# Patient Record
Sex: Female | Born: 1970
Health system: Southern US, Community
[De-identification: ages and names within clinical notes are randomized; demographics above are authoritative.]

## PROBLEM LIST (undated history)

## (undated) DIAGNOSIS — R7303 Prediabetes: Secondary | ICD-10-CM

## (undated) DIAGNOSIS — I10 Essential (primary) hypertension: Secondary | ICD-10-CM

---

## 2004-09-19 ENCOUNTER — Other Ambulatory Visit: Admission: RE | Admit: 2004-09-19 | Discharge: 2004-09-19 | Payer: Self-pay | Admitting: Family Medicine

## 2004-11-07 ENCOUNTER — Ambulatory Visit (HOSPITAL_COMMUNITY): Admission: RE | Admit: 2004-11-07 | Discharge: 2004-11-07 | Payer: Self-pay | Admitting: Family Medicine

## 2005-12-04 ENCOUNTER — Other Ambulatory Visit: Admission: RE | Admit: 2005-12-04 | Discharge: 2005-12-04 | Payer: Self-pay | Admitting: Family Medicine

## 2006-12-04 ENCOUNTER — Encounter: Admission: RE | Admit: 2006-12-04 | Discharge: 2006-12-04 | Payer: Self-pay | Admitting: Family Medicine

## 2006-12-10 ENCOUNTER — Other Ambulatory Visit: Admission: RE | Admit: 2006-12-10 | Discharge: 2006-12-10 | Payer: Self-pay | Admitting: Family Medicine

## 2006-12-19 ENCOUNTER — Ambulatory Visit (HOSPITAL_COMMUNITY): Admission: RE | Admit: 2006-12-19 | Discharge: 2006-12-20 | Payer: Self-pay | Admitting: Neurological Surgery

## 2008-01-02 ENCOUNTER — Other Ambulatory Visit: Admission: RE | Admit: 2008-01-02 | Discharge: 2008-01-02 | Payer: Self-pay | Admitting: Family Medicine

## 2008-07-23 ENCOUNTER — Encounter: Admission: RE | Admit: 2008-07-23 | Discharge: 2008-07-23 | Payer: Self-pay | Admitting: Neurological Surgery

## 2009-07-06 ENCOUNTER — Encounter: Admission: RE | Admit: 2009-07-06 | Discharge: 2009-07-06 | Payer: Self-pay | Admitting: *Deleted

## 2010-01-13 ENCOUNTER — Emergency Department (HOSPITAL_COMMUNITY): Admission: EM | Admit: 2010-01-13 | Discharge: 2010-01-13 | Payer: Self-pay | Admitting: Emergency Medicine

## 2010-01-25 ENCOUNTER — Other Ambulatory Visit: Admission: RE | Admit: 2010-01-25 | Discharge: 2010-01-25 | Payer: Self-pay | Admitting: *Deleted

## 2010-10-24 LAB — COMPREHENSIVE METABOLIC PANEL
ALT: 45 U/L — ABNORMAL HIGH (ref 0–35)
Creatinine, Ser: 0.68 mg/dL (ref 0.4–1.2)
GFR calc Af Amer: >60 mL/min (ref >60)
GFR calc non Af Amer: >60 mL/min (ref >60)
Potassium: 4.2 mEq/L (ref 3.5–5.1)
Sodium: 138 mEq/L (ref 135–145)

## 2010-10-24 LAB — DIFFERENTIAL
Basophils Absolute: 0 10*3/uL (ref 0.0–0.1)
Basophils Relative: 1 % (ref 0–1)
Eosinophils Absolute: 0.2 10*3/uL (ref 0.0–0.7)
Monocytes Relative: 7 % (ref 3–12)
Neutro Abs: 6.6 10*3/uL (ref 1.7–7.7)
Neutrophils Relative %: 69 % (ref 43–77)

## 2010-10-24 LAB — CBC
Hemoglobin: 12.1 g/dL (ref 12.0–15.0)
MCHC: 33.6 g/dL (ref 30.0–36.0)
WBC: 9.5 10*3/uL (ref 4.0–10.5)

## 2010-10-24 LAB — MAGNESIUM: Magnesium: 2.2 mg/dL (ref 1.5–2.5)

## 2010-12-20 NOTE — Op Note (Signed)
NAMETRISTYN, PHARRIS NO.:  [id number]   MEDICAL RECORD NO.:  [id number]          PATIENT TYPE:  OIB   LOCATION:  6739                         FACILITY:  [facility]   PHYSICIAN:  Tia Alert, MD     DATE OF BIRTH:  Aug 19, 1970   DATE OF PROCEDURE:  12/19/2006  DATE OF DISCHARGE:                               OPERATIVE REPORT   PREOPERATIVE DIAGNOSIS:  Left L5-S1 disk herniation with left S1  radiculopathy.   POSTOPERATIVE DIAGNOSIS:  Left L5-S1 disk herniation with left S1  radiculopathy.   PROCEDURE:  Left lumbar hemilaminectomy, medial facetectomy,  foraminotomy, L5-S1 on the left, followed by microdiskectomy, L5-S1 on  the left, utilizing microscopic dissection.   SURGEON:  Tia Alert, MD   ASSISTANT:  Donalee Citrin, MD   ANESTHESIA:  General endotracheal.   COMPLICATIONS:  None apparent.   INDICATIONS FOR PROCEDURE:  Ms. Rossa is a very pleasant 40 year old  female who was referred with back and leg pain.  She had numbness in her  foot and weakness in her calf.  She had an MRI which showed a very large  disk herniation at L5-S1 on the left side with compression of the left  S1 nerve root and canal stenosis.  I recommended a microdiskectomy of S1  on the left side.  She understood the risks, benefits, and expected  outcome and wished to proceed.   DESCRIPTION OF PROCEDURE:  The patient was taken to the operating room  and after the induction of adequate generalized endotracheal anesthesia,  she was rolled into the prone position onto the Wilson frame and all  pressure points were padded.  Her lumbar region was prepped with  DuraPrep and then draped in the usual sterile fashion.  Local anesthesia  5 mL was injected and a small dorsal midline incision was made and  carried down to the lumbosacral fascia.  The fascia was opened and the  paraspinous musculature was taken down in a subperiosteal fashion to  expose L5-S1 on the left.  Intraoperative x-ray  confirmed my level and  then the high-speed drill and the Kerrison punch was used to perform a  hemilaminectomy, medial facetectomy and foraminotomy at L5-S1 on the  left side.  I removed the yellow ligament to expose the underlying dura  and nerve root.  The dura was tented up over a large free fragment.  This was removed with a nerve hook and pituitary rongeur.  We actually  removed 4 moderate-sized free fragments.  Then the nerve relaxed.  I  then found subannular herniation.  I incised the annulus and performed a  thorough intradiskal diskectomy until the nerve and dura lay flat.  I  then palpated with a coronary dilator and a nerve hook into the midline  and into the foramen to assure adequate decompression.  I felt no more  compression of the nerve root.  I irrigated with saline solution  containing bacitracin, dried all bleeding points, lined the dura with  Gelfoam, and then closed the muscle with 0 Vicryl, closed the  subcutaneous and subcuticular  tissue  with layers of 2-0 and 3-0 Vicryl, and closed the skin with Benzoin and  Steri-Strips.  The drapes were removed.  A sterile dressing was applied.  The patient was awakened from general anesthesia and transported to the  recovery room in stable condition.  At the end of the procedure all  sponge, needle and instrument counts were correct.      Tia Alert, MD  Electronically Signed     DSJ/MEDQ  D:  12/19/2006  T:  12/20/2006  Job:  [id number]

## 2011-11-30 ENCOUNTER — Other Ambulatory Visit (HOSPITAL_COMMUNITY)
Admission: RE | Admit: 2011-11-30 | Discharge: 2011-11-30 | Disposition: A | Discharge status: Home / Self Care | Payer: BC Managed Care – PPO | Source: Ambulatory Visit | Attending: Family Medicine | Admitting: Family Medicine

## 2011-11-30 ENCOUNTER — Other Ambulatory Visit: Payer: Self-pay | Admitting: Family Medicine

## 2011-11-30 DIAGNOSIS — Z1159 Encounter for screening for other viral diseases: Secondary | ICD-10-CM | POA: Insufficient documentation

## 2011-11-30 DIAGNOSIS — Z124 Encounter for screening for malignant neoplasm of cervix: Secondary | ICD-10-CM | POA: Insufficient documentation

## 2012-12-10 ENCOUNTER — Other Ambulatory Visit (HOSPITAL_COMMUNITY): Payer: Self-pay

## 2012-12-10 DIAGNOSIS — Z1231 Encounter for screening mammogram for malignant neoplasm of breast: Secondary | ICD-10-CM

## 2012-12-13 ENCOUNTER — Ambulatory Visit (HOSPITAL_COMMUNITY)
Admission: RE | Admit: 2012-12-13 | Discharge: 2012-12-13 | Disposition: A | Discharge status: Home / Self Care | Payer: BC Managed Care – PPO | Source: Ambulatory Visit

## 2012-12-13 DIAGNOSIS — Z1231 Encounter for screening mammogram for malignant neoplasm of breast: Secondary | ICD-10-CM

## 2013-01-22 ENCOUNTER — Other Ambulatory Visit: Payer: Self-pay | Admitting: Dermatology

## 2013-09-12 ENCOUNTER — Telehealth: Payer: Self-pay | Admitting: Cardiology

## 2013-09-12 NOTE — Telephone Encounter (Signed)
New message     Refill  Amlodipine besylate 5mg ; metoprolol 25mg ; hctz 25mg ---------[facility] script---90da supply.  Pt has not seen dr Mayford Knifeturner at new office

## 2013-09-12 NOTE — Telephone Encounter (Signed)
PCP needs to order her meds

## 2013-09-12 NOTE — Telephone Encounter (Signed)
Made pt aware

## 2013-09-12 NOTE — Telephone Encounter (Signed)
Dr Mayford Knifeurner this pt you last saw on 12/2012. At the end of the visit you said PRN follow up. Ok to give pt a year worth of her meds?

## 2014-02-20 ENCOUNTER — Other Ambulatory Visit (HOSPITAL_COMMUNITY): Payer: Self-pay | Admitting: Family Medicine

## 2014-02-20 DIAGNOSIS — Z1231 Encounter for screening mammogram for malignant neoplasm of breast: Secondary | ICD-10-CM

## 2014-03-13 ENCOUNTER — Ambulatory Visit (HOSPITAL_COMMUNITY)
Admission: RE | Admit: 2014-03-13 | Discharge: 2014-03-13 | Disposition: A | Discharge status: Home / Self Care | Payer: BC Managed Care – PPO | Source: Ambulatory Visit | Attending: Family Medicine | Admitting: Family Medicine

## 2014-03-13 DIAGNOSIS — Z1231 Encounter for screening mammogram for malignant neoplasm of breast: Secondary | ICD-10-CM | POA: Insufficient documentation

## 2015-03-25 ENCOUNTER — Other Ambulatory Visit: Payer: Self-pay | Admitting: Family Medicine

## 2015-03-25 ENCOUNTER — Other Ambulatory Visit (HOSPITAL_COMMUNITY)
Admission: RE | Admit: 2015-03-25 | Discharge: 2015-03-25 | Disposition: A | Discharge status: Home / Self Care | Payer: BLUE CROSS/BLUE SHIELD | Source: Ambulatory Visit | Attending: Family Medicine | Admitting: Family Medicine

## 2015-03-25 DIAGNOSIS — Z01411 Encounter for gynecological examination (general) (routine) with abnormal findings: Secondary | ICD-10-CM | POA: Diagnosis present

## 2015-03-25 DIAGNOSIS — Z1151 Encounter for screening for human papillomavirus (HPV): Secondary | ICD-10-CM | POA: Insufficient documentation

## 2015-03-26 LAB — CYTOLOGY - PAP

## 2015-07-16 ENCOUNTER — Other Ambulatory Visit: Payer: Self-pay

## 2015-07-16 DIAGNOSIS — Z1231 Encounter for screening mammogram for malignant neoplasm of breast: Secondary | ICD-10-CM

## 2015-08-08 HISTORY — PX: BREAST BIOPSY: SHX20

## 2015-08-18 ENCOUNTER — Other Ambulatory Visit: Payer: Self-pay | Admitting: Family Medicine

## 2015-08-18 ENCOUNTER — Ambulatory Visit
Admission: RE | Admit: 2015-08-18 | Discharge: 2015-08-18 | Disposition: A | Discharge status: Home / Self Care | Payer: BLUE CROSS/BLUE SHIELD | Source: Ambulatory Visit

## 2015-08-18 DIAGNOSIS — Z1231 Encounter for screening mammogram for malignant neoplasm of breast: Secondary | ICD-10-CM

## 2015-08-18 DIAGNOSIS — R928 Other abnormal and inconclusive findings on diagnostic imaging of breast: Secondary | ICD-10-CM

## 2015-08-25 ENCOUNTER — Ambulatory Visit
Admission: RE | Admit: 2015-08-25 | Discharge: 2015-08-25 | Disposition: A | Discharge status: Home / Self Care | Payer: BLUE CROSS/BLUE SHIELD | Source: Ambulatory Visit | Attending: Family Medicine | Admitting: Family Medicine

## 2015-08-25 ENCOUNTER — Other Ambulatory Visit: Payer: Self-pay | Admitting: Family Medicine

## 2015-08-25 DIAGNOSIS — R928 Other abnormal and inconclusive findings on diagnostic imaging of breast: Secondary | ICD-10-CM

## 2015-08-27 ENCOUNTER — Other Ambulatory Visit: Payer: Self-pay | Admitting: Family Medicine

## 2015-08-27 DIAGNOSIS — R928 Other abnormal and inconclusive findings on diagnostic imaging of breast: Secondary | ICD-10-CM

## 2015-08-30 ENCOUNTER — Ambulatory Visit
Admission: RE | Admit: 2015-08-30 | Discharge: 2015-08-30 | Disposition: A | Discharge status: Home / Self Care | Payer: BLUE CROSS/BLUE SHIELD | Source: Ambulatory Visit | Attending: Family Medicine | Admitting: Family Medicine

## 2015-08-30 DIAGNOSIS — R928 Other abnormal and inconclusive findings on diagnostic imaging of breast: Secondary | ICD-10-CM

## 2016-11-03 ENCOUNTER — Other Ambulatory Visit: Payer: Self-pay | Admitting: Family Medicine

## 2016-11-03 DIAGNOSIS — Z1231 Encounter for screening mammogram for malignant neoplasm of breast: Secondary | ICD-10-CM

## 2016-11-22 ENCOUNTER — Ambulatory Visit
Admission: RE | Admit: 2016-11-22 | Discharge: 2016-11-22 | Disposition: A | Discharge status: Home / Self Care | Payer: BLUE CROSS/BLUE SHIELD | Source: Ambulatory Visit | Attending: Family Medicine | Admitting: Family Medicine

## 2016-11-22 DIAGNOSIS — Z1231 Encounter for screening mammogram for malignant neoplasm of breast: Secondary | ICD-10-CM

## 2016-11-27 ENCOUNTER — Other Ambulatory Visit: Payer: Self-pay | Admitting: Family Medicine

## 2016-11-27 DIAGNOSIS — R928 Other abnormal and inconclusive findings on diagnostic imaging of breast: Secondary | ICD-10-CM

## 2016-12-01 ENCOUNTER — Ambulatory Visit
Admission: RE | Admit: 2016-12-01 | Discharge: 2016-12-01 | Disposition: A | Discharge status: Home / Self Care | Payer: BLUE CROSS/BLUE SHIELD | Source: Ambulatory Visit | Attending: Family Medicine | Admitting: Family Medicine

## 2016-12-01 DIAGNOSIS — R928 Other abnormal and inconclusive findings on diagnostic imaging of breast: Secondary | ICD-10-CM

## 2017-08-06 DIAGNOSIS — M7661 Achilles tendinitis, right leg: Secondary | ICD-10-CM | POA: Diagnosis not present

## 2017-08-06 DIAGNOSIS — M71571 Other bursitis, not elsewhere classified, right ankle and foot: Secondary | ICD-10-CM | POA: Diagnosis not present

## 2017-08-06 DIAGNOSIS — M7731 Calcaneal spur, right foot: Secondary | ICD-10-CM | POA: Diagnosis not present

## 2017-08-06 DIAGNOSIS — M7662 Achilles tendinitis, left leg: Secondary | ICD-10-CM | POA: Diagnosis not present

## 2017-08-06 DIAGNOSIS — M7732 Calcaneal spur, left foot: Secondary | ICD-10-CM | POA: Diagnosis not present

## 2017-08-06 DIAGNOSIS — M71572 Other bursitis, not elsewhere classified, left ankle and foot: Secondary | ICD-10-CM | POA: Diagnosis not present

## 2017-08-14 DIAGNOSIS — M71571 Other bursitis, not elsewhere classified, right ankle and foot: Secondary | ICD-10-CM | POA: Diagnosis not present

## 2017-08-14 DIAGNOSIS — M7661 Achilles tendinitis, right leg: Secondary | ICD-10-CM | POA: Diagnosis not present

## 2017-08-14 DIAGNOSIS — M71572 Other bursitis, not elsewhere classified, left ankle and foot: Secondary | ICD-10-CM | POA: Diagnosis not present

## 2017-08-27 DIAGNOSIS — M7662 Achilles tendinitis, left leg: Secondary | ICD-10-CM | POA: Diagnosis not present

## 2017-08-27 DIAGNOSIS — M7661 Achilles tendinitis, right leg: Secondary | ICD-10-CM | POA: Diagnosis not present

## 2017-10-02 DIAGNOSIS — M5432 Sciatica, left side: Secondary | ICD-10-CM | POA: Diagnosis not present

## 2017-10-08 DIAGNOSIS — Z6836 Body mass index (BMI) 36.0-36.9, adult: Secondary | ICD-10-CM | POA: Diagnosis not present

## 2017-10-08 DIAGNOSIS — M545 Low back pain: Secondary | ICD-10-CM | POA: Diagnosis not present

## 2017-10-08 DIAGNOSIS — I10 Essential (primary) hypertension: Secondary | ICD-10-CM | POA: Diagnosis not present

## 2017-10-26 DIAGNOSIS — M722 Plantar fascial fibromatosis: Secondary | ICD-10-CM | POA: Diagnosis not present

## 2017-10-26 DIAGNOSIS — M25571 Pain in right ankle and joints of right foot: Secondary | ICD-10-CM | POA: Diagnosis not present

## 2017-10-26 DIAGNOSIS — M25572 Pain in left ankle and joints of left foot: Secondary | ICD-10-CM | POA: Diagnosis not present

## 2017-10-26 DIAGNOSIS — M67879 Other specified disorders of synovium and tendon, unspecified ankle and foot: Secondary | ICD-10-CM | POA: Diagnosis not present

## 2017-11-12 ENCOUNTER — Other Ambulatory Visit: Payer: Self-pay | Admitting: Family Medicine

## 2017-11-12 DIAGNOSIS — Z1231 Encounter for screening mammogram for malignant neoplasm of breast: Secondary | ICD-10-CM

## 2017-11-30 ENCOUNTER — Ambulatory Visit
Admission: RE | Admit: 2017-11-30 | Discharge: 2017-11-30 | Disposition: A | Discharge status: Home / Self Care | Payer: BLUE CROSS/BLUE SHIELD | Source: Ambulatory Visit | Attending: Family Medicine | Admitting: Family Medicine

## 2017-11-30 DIAGNOSIS — Z1231 Encounter for screening mammogram for malignant neoplasm of breast: Secondary | ICD-10-CM | POA: Diagnosis not present

## 2018-03-26 DIAGNOSIS — I10 Essential (primary) hypertension: Secondary | ICD-10-CM | POA: Diagnosis not present

## 2018-03-26 DIAGNOSIS — E785 Hyperlipidemia, unspecified: Secondary | ICD-10-CM | POA: Diagnosis not present

## 2018-04-09 DIAGNOSIS — Z01419 Encounter for gynecological examination (general) (routine) without abnormal findings: Secondary | ICD-10-CM | POA: Diagnosis not present

## 2018-04-10 DIAGNOSIS — H029 Unspecified disorder of eyelid: Secondary | ICD-10-CM | POA: Diagnosis not present

## 2018-04-10 DIAGNOSIS — H524 Presbyopia: Secondary | ICD-10-CM | POA: Diagnosis not present

## 2018-04-10 DIAGNOSIS — D3141 Benign neoplasm of right ciliary body: Secondary | ICD-10-CM | POA: Diagnosis not present

## 2018-05-21 DIAGNOSIS — D485 Neoplasm of uncertain behavior of skin: Secondary | ICD-10-CM | POA: Diagnosis not present

## 2018-05-21 DIAGNOSIS — D23121 Other benign neoplasm of skin of left upper eyelid, including canthus: Secondary | ICD-10-CM | POA: Diagnosis not present

## 2018-07-03 DIAGNOSIS — D485 Neoplasm of uncertain behavior of skin: Secondary | ICD-10-CM | POA: Diagnosis not present

## 2018-07-03 DIAGNOSIS — L820 Inflamed seborrheic keratosis: Secondary | ICD-10-CM | POA: Diagnosis not present

## 2018-07-03 DIAGNOSIS — L919 Hypertrophic disorder of the skin, unspecified: Secondary | ICD-10-CM | POA: Diagnosis not present

## 2019-04-11 DIAGNOSIS — Z30432 Encounter for removal of intrauterine contraceptive device: Secondary | ICD-10-CM | POA: Diagnosis not present

## 2019-04-11 DIAGNOSIS — Z01411 Encounter for gynecological examination (general) (routine) with abnormal findings: Secondary | ICD-10-CM | POA: Diagnosis not present

## 2019-04-23 DIAGNOSIS — Z0000 Encounter for general adult medical examination without abnormal findings: Secondary | ICD-10-CM | POA: Diagnosis not present

## 2019-04-30 DIAGNOSIS — I10 Essential (primary) hypertension: Secondary | ICD-10-CM | POA: Diagnosis not present

## 2019-04-30 DIAGNOSIS — Z23 Encounter for immunization: Secondary | ICD-10-CM | POA: Diagnosis not present

## 2019-04-30 DIAGNOSIS — E785 Hyperlipidemia, unspecified: Secondary | ICD-10-CM | POA: Diagnosis not present

## 2019-05-01 ENCOUNTER — Other Ambulatory Visit: Payer: Self-pay | Admitting: Family Medicine

## 2019-05-01 DIAGNOSIS — Z1231 Encounter for screening mammogram for malignant neoplasm of breast: Secondary | ICD-10-CM

## 2019-06-16 ENCOUNTER — Ambulatory Visit
Admission: RE | Admit: 2019-06-16 | Discharge: 2019-06-16 | Disposition: A | Discharge status: Home / Self Care | Payer: BC Managed Care – PPO | Source: Ambulatory Visit | Attending: Family Medicine | Admitting: Family Medicine

## 2019-06-16 ENCOUNTER — Other Ambulatory Visit: Payer: Self-pay

## 2019-06-16 DIAGNOSIS — Z1231 Encounter for screening mammogram for malignant neoplasm of breast: Secondary | ICD-10-CM | POA: Diagnosis not present

## 2019-11-15 ENCOUNTER — Ambulatory Visit: Payer: 59 | Attending: Internal Medicine

## 2019-11-15 DIAGNOSIS — Z23 Encounter for immunization: Secondary | ICD-10-CM

## 2019-11-15 NOTE — Progress Notes (Signed)
   Covid-19 Vaccination Clinic  Name:  Rachael Moran    MRN: [id number] DOB: Feb 05, 1971  11/15/2019  Rachael Moran was observed post Covid-19 immunization for 15 minutes without incident. She was provided with Vaccine Information Sheet and instruction to access the V-Safe system.   Rachael Moran was instructed to call 911 with any severe reactions post vaccine: Marland Kitchen Difficulty breathing  . Swelling of face and throat  . A fast heartbeat  . A bad rash all over body  . Dizziness and weakness   Immunizations Administered    Name Date Dose VIS Date Route   Pfizer COVID-19 Vaccine 11/15/2019  9:49 AM 0.3 mL 07/18/2019 Intramuscular   Manufacturer: [employer], [employer]   Lot: [id number]   NDC: [address]-1

## 2019-12-08 ENCOUNTER — Ambulatory Visit: Payer: 59 | Attending: Internal Medicine

## 2019-12-08 DIAGNOSIS — Z23 Encounter for immunization: Secondary | ICD-10-CM

## 2019-12-08 NOTE — Progress Notes (Signed)
   Covid-19 Vaccination Clinic  Name:  ALIYHA FORNES    MRN: [id number] DOB: 03/07/1971  12/08/2019  Ms. Ellsworth was observed post Covid-19 immunization for 15 minutes without incident. She was provided with Vaccine Information Sheet and instruction to access the V-Safe system.   Ms. Laufer was instructed to call 911 with any severe reactions post vaccine: Marland Kitchen Difficulty breathing  . Swelling of face and throat  . A fast heartbeat  . A bad rash all over body  . Dizziness and weakness   Immunizations Administered    Name Date Dose VIS Date Route   Pfizer COVID-19 Vaccine 12/08/2019  3:22 PM 0.3 mL 10/01/2018 Intramuscular   Manufacturer: [employer], [employer]   Lot: [id number]   NDC: [address]-6

## 2020-06-07 ENCOUNTER — Other Ambulatory Visit: Payer: Self-pay | Admitting: Family Medicine

## 2020-06-07 DIAGNOSIS — Z1231 Encounter for screening mammogram for malignant neoplasm of breast: Secondary | ICD-10-CM

## 2020-06-21 ENCOUNTER — Other Ambulatory Visit: Payer: Self-pay

## 2020-06-21 ENCOUNTER — Ambulatory Visit
Admission: RE | Admit: 2020-06-21 | Discharge: 2020-06-21 | Disposition: A | Discharge status: Home / Self Care | Payer: 59 | Source: Ambulatory Visit | Attending: Family Medicine | Admitting: Family Medicine

## 2020-06-21 DIAGNOSIS — Z1231 Encounter for screening mammogram for malignant neoplasm of breast: Secondary | ICD-10-CM

## 2020-07-06 ENCOUNTER — Ambulatory Visit: Payer: No Typology Code available for payment source | Admitting: Podiatry

## 2020-07-06 ENCOUNTER — Other Ambulatory Visit: Payer: Self-pay

## 2020-07-06 DIAGNOSIS — Z79899 Other long term (current) drug therapy: Secondary | ICD-10-CM | POA: Diagnosis not present

## 2020-07-06 DIAGNOSIS — B351 Tinea unguium: Secondary | ICD-10-CM

## 2020-07-06 MED ORDER — TERBINAFINE HCL 250 MG PO TABS: 250.0000 mg | ORAL_TABLET | Freq: Every day | ORAL | 0 refills | Status: DC

## 2020-07-06 NOTE — Progress Notes (Signed)
Subjective:   Patient ID: Rachael Moran, female   DOB: 49 y.o.   MRN: [id number]   HPI 49 year old female presents the office today for concerns of toenail discoloration to left big toe which started in January. She states that she gets occasional discomfort the toenail but denies any swelling or redness. She first saw her primary care physician's office and was told that it was damaged the nail will grow out. She later followed up with so she had fungus and she was started on Lamisil. She completed about 6 weeks of the treatment they did not see much improvement with so this was discontinued was referred for further evaluation. She has no other concerns today.   Review of Systems  All other systems reviewed and are negative.  No past medical history on file.  Past Surgical History:  Procedure Laterality Date  . BREAST BIOPSY Right 2017    BIPHASIC EPITHELIAL AND STROMAL LESION      Current Outpatient Medications:  .  amLODipine (NORVASC) 5 MG tablet, Take 5 mg by mouth daily., Disp: , Rfl:  .  diazepam (VALIUM) 2 MG tablet, diazepam 2 mg tablet, Disp: , Rfl:  .  hydrochlorothiazide (HYDRODIURIL) 25 MG tablet, Take 25 mg by mouth daily., Disp: , Rfl:  .  meloxicam (MOBIC) 15 MG tablet, Take 15 mg by mouth daily., Disp: , Rfl:  .  misoprostol (CYTOTEC) 200 MCG tablet, misoprostol 200 mcg tablet, Disp: , Rfl:  .  neomycin-polymyxin-dexameth (MAXITROL) 0.1 % OINT, neomycin 3.5 mg/g-polymyxin B 10,000 unit/g-dexameth 0.1 % eye oint, Disp: , Rfl:  .  sertraline (ZOLOFT) 50 MG tablet, Take by mouth., Disp: , Rfl:  .  terbinafine (LAMISIL) 250 MG tablet, , Disp: , Rfl:  .  terbinafine (LAMISIL) 250 MG tablet, Take 1 tablet (250 mg total) by mouth daily., Disp: 60 tablet, Rfl: 0  Allergies  Allergen Reactions  . Lisinopril         Objective:  Physical Exam  General: AAO x3, NAD  Dermatological: Right hallux toenail is dystrophic, hypertrophic with yellow and white discoloration of the  nail. No significant pain today and there is no edema, erythema or signs of infection. There is no open lesions. There are no open sores, no preulcerative lesions, no rash or signs of infection present.  Vascular: Dorsalis Pedis artery and Posterior Tibial artery pedal pulses are 2/4 bilateral with immedate capillary fill time. There is no pain with calf compression, swelling, warmth, erythema.   Neruologic: Grossly intact via light touch bilateral.  Musculoskeletal: No gross boney pedal deformities bilateral. No pain, crepitus, or limitation noted with foot and ankle range of motion bilateral. Muscular strength 5/5 in all groups tested bilateral.  Gait: Unassisted, Nonantalgic.       Assessment:   Onychomycosis right hallux    Plan:  -Treatment options discussed including all alternatives, risks, and complications -Etiology of symptoms were discussed -I do think that she is having fungus. Completed the pictures but she showed me the nail has grown out slightly I do think the terbinafine was helpful. She has 6 more weeks of the medication and she is in a continue with this but I went ahead and prescribed an additional 2 months but will recheck a CBC LFT in about 4 to 6 weeks. She recently had blood work performed by her primary care physician and we will obtain the results of this to make sure that her liver function was checked recently. If not I will order this.  Trula Slade DPM

## 2020-07-06 NOTE — Patient Instructions (Signed)
Terbinafine oral granules What is this medicine? TERBINAFINE (TER bin a feen) is an antifungal medicine. It is used to treat certain kinds of fungal or yeast infections. This medicine may be used for other purposes; ask your health care provider or pharmacist if you have questions. COMMON BRAND NAME(S): Lamisil What should I tell my health care provider before I take this medicine? They need to know if you have any of these conditions:  drink alcoholic beverages  kidney disease  liver disease  an unusual or allergic reaction to Terbinafine, other medicines, foods, dyes, or preservatives  pregnant or trying to get pregnant  breast-feeding How should I use this medicine? Take this medicine by mouth. Follow the directions on the prescription label. Hold packet with cut line on top. Shake packet gently to settle contents. Tear packet open along cut line, or use scissors to cut across line. Carefully pour the entire contents of packet onto a spoonful of a soft food, such as pudding or other soft, non-acidic food such as mashed potatoes (do NOT use applesauce or a fruit-based food). If two packets are required for each dose, you may either sprinkle the content of both packets on one spoonful of non-acidic food, or sprinkle the contents of both packets on two spoonfuls of non-acidic food. Make sure that no granules remain in the packet. Swallow the mxiture of the food and granules without chewing. Take your medicine at regular intervals. Do not take it more often than directed. Take all of your medicine as directed even if you think you are better. Do not skip doses or stop your medicine early. Contact your pediatrician or health care professional regarding the use of this medicine in children. While this medicine may be prescribed for children as young as 4 years for selected conditions, precautions do apply. Overdosage: If you think you have taken too much of this medicine contact a poison control  center or emergency room at once. NOTE: This medicine is only for you. Do not share this medicine with others. What if I miss a dose? If you miss a dose, take it as soon as you can. If it is almost time for your next dose, take only that dose. Do not take double or extra doses. What may interact with this medicine? Do not take this medicine with any of the following medications:  thioridazine This medicine may also interact with the following medications:  beta-blockers  caffeine  cimetidine  cyclosporine  MAOIs like Carbex, Eldepryl, Marplan, Nardil, and Parnate  medicines for fungal infections like fluconazole and ketoconazole  medicines for irregular heartbeat like amiodarone, flecainide and propafenone  rifampin  SSRIs like citalopram, escitalopram, fluoxetine, fluvoxamine, paroxetine and sertraline  tricyclic antidepressants like amitriptyline, clomipramine, desipramine, imipramine, nortriptyline, and others  warfarin This list may not describe all possible interactions. Give your health care provider a list of all the medicines, herbs, non-prescription drugs, or dietary supplements you use. Also tell them if you smoke, drink alcohol, or use illegal drugs. Some items may interact with your medicine. What should I watch for while using this medicine? Your doctor may monitor your liver function. Tell your doctor right away if you have nausea or vomiting, loss of appetite, stomach pain on your right upper side, yellow skin, dark urine, light stools, or are over tired. This medicine may cause serious skin reactions. They can happen weeks to months after starting the medicine. Contact your health care provider right away if you notice fevers or flu-like symptoms   with a rash. The rash may be red or purple and then turn into blisters or peeling of the skin. Or, you might notice a red rash with swelling of the face, lips or lymph nodes in your neck or under your arms. You need to take  this medicine for 6 weeks or longer to cure the fungal infection. Take your medicine regularly for as long as your doctor or health care provider tells you to. What side effects may I notice from receiving this medicine? Side effects that you should report to your doctor or health care professional as soon as possible:  allergic reactions like skin rash or hives, swelling of the face, lips, or tongue  change in vision  dark urine  fever or infection  general ill feeling or flu-like symptoms  light-colored stools  loss of appetite, nausea  rash, fever, and swollen lymph nodes  redness, blistering, peeling or loosening of the skin, including inside the mouth  right upper belly pain  unusually weak or tired  yellowing of the eyes or skin Side effects that usually do not require medical attention (report to your doctor or health care professional if they continue or are bothersome):  changes in taste  diarrhea  hair loss  muscle or joint pain  stomach upset This list may not describe all possible side effects. Call your doctor for medical advice about side effects. You may report side effects to FDA at 1-800-FDA-1088. Where should I keep my medicine? Keep out of the reach of children. Store at room temperature between 15 and 30 degrees C (59 and 86 degrees F). Throw away any unused medicine after the expiration date. NOTE: This sheet is a summary. It may not cover all possible information. If you have questions about this medicine, talk to your doctor, pharmacist, or health care provider.  2020 Elsevier/Gold Standard (2018-11-01 15:35:11)  

## 2020-07-07 ENCOUNTER — Telehealth: Payer: Self-pay

## 2020-07-07 NOTE — Telephone Encounter (Signed)
Received lab results via fax from Dr. Marland Mcalpine office. Reviewed with Dr. Ardelle Anton. I called and spoke with the patient, advising that her liver function is normal and she can begin taking Lamisil. She will have new labs drawn in 4 to 6 weeks.

## 2020-08-19 ENCOUNTER — Ambulatory Visit: Payer: No Typology Code available for payment source | Admitting: Podiatry

## 2020-08-19 ENCOUNTER — Other Ambulatory Visit: Payer: Self-pay

## 2020-08-19 DIAGNOSIS — B351 Tinea unguium: Secondary | ICD-10-CM | POA: Diagnosis not present

## 2020-08-19 DIAGNOSIS — Z79899 Other long term (current) drug therapy: Secondary | ICD-10-CM | POA: Diagnosis not present

## 2020-08-19 LAB — CBC WITH DIFFERENTIAL/PLATELET
Absolute Monocytes: 577 cells/uL (ref 200–950)
Basophils Absolute: 37 cells/uL (ref 0–200)
Basophils Relative: 0.5 %
Eosinophils Absolute: 311 cells/uL (ref 15–500)
Eosinophils Relative: 4.2 %
HCT: 39.1 % (ref 35.0–45.0)
Hemoglobin: 13.1 g/dL (ref 11.7–15.5)
Lymphs Abs: 1843 cells/uL (ref 850–3900)
MCH: 28.2 pg (ref 27.0–33.0)
MCHC: 33.5 g/dL (ref 32.0–36.0)
MCV: 84.1 fL (ref 80.0–100.0)
MPV: 10.1 fL (ref 7.5–12.5)
Monocytes Relative: 7.8 %
Neutro Abs: 4632 cells/uL (ref 1500–7800)
Neutrophils Relative %: 62.6 %
Platelets: 328 10*3/uL (ref 140–400)
RBC: 4.65 10*6/uL (ref 3.80–5.10)
RDW: 13.7 % (ref 11.0–15.0)
Total Lymphocyte: 24.9 %
WBC: 7.4 10*3/uL (ref 3.8–10.8)

## 2020-08-19 LAB — HEPATIC FUNCTION PANEL
AG Ratio: 1.6 (calc) (ref 1.0–2.5)
ALT: 26 U/L (ref 6–29)
AST: 19 U/L (ref 10–35)
Albumin: 4.5 g/dL (ref 3.6–5.1)
Alkaline phosphatase (APISO): 92 U/L (ref 37–153)
Bilirubin, Direct: 0 mg/dL (ref 0.0–0.2)
Globulin: 2.8 g/dL (calc) (ref 1.9–3.7)
Indirect Bilirubin: 0.2 mg/dL (calc) (ref 0.2–1.2)
Total Bilirubin: 0.2 mg/dL (ref 0.2–1.2)
Total Protein: 7.3 g/dL (ref 6.1–8.1)

## 2020-08-25 NOTE — Progress Notes (Signed)
Subjective: 50 year old female presents the office today for follow-up evaluation of nail fungus, currently on Lamisil.  She states that she is tolerating medication well without any side effects.  Recently had blood work performed.  Denies any systemic complaints such as fevers, chills, nausea, vomiting. No acute changes since last appointment, and no other complaints at this time.   Objective: AAO x3, NAD DP/PT pulses palpable bilaterally, CRT less than 3 seconds There is some clearing of the proximal aspect the nail fold however there nails in general still hypertrophic, dystrophic with yellow discoloration.  No pain in the nails currently there is no incision drainage signs of infection. No pain with calf compression, swelling, warmth, erythema  Assessment: 50 year old female with onychomycosis, currently on Lamisil  Plan: -All treatment options discussed with the patient including all alternatives, risks, complications.  -So far tolerating Lamisil well.  Reviewed blood work which is normal.  We will finish out the 90 days of Lamisil.  Discussed that at that point if needed we can continue but will recheck a CBC and LFT we decided to extend longer than the 90 days. -Patient encouraged to call the office with any questions, concerns, change in symptoms.   Vivi Barrack DPM

## 2020-09-27 ENCOUNTER — Telehealth: Payer: Self-pay

## 2020-09-27 NOTE — Telephone Encounter (Signed)
Pt is seeing Dr. Ardelle Anton for toe fungus. Pt would like to know if results are back from the toenail culture. Pt didn't know if she had missed a call due to cell phone issue. Pt can be reach via home number at [phone].

## 2020-09-28 NOTE — Telephone Encounter (Signed)
Attempted to call patient left VM

## 2020-09-29 NOTE — Telephone Encounter (Signed)
I have called patient to go over the results. She is going to finish the Lamisil she has. I am going to order a compound cream from [place]. We will call her with laser pricing.

## 2020-09-29 NOTE — Telephone Encounter (Signed)
Patient returning phone call from Dr. Ardelle Anton for lab results. Please call patient at earliest convenience

## 2020-10-01 ENCOUNTER — Other Ambulatory Visit: Payer: Self-pay

## 2020-10-01 ENCOUNTER — Telehealth: Payer: Self-pay

## 2020-10-01 MED ORDER — NONFORMULARY OR COMPOUNDED ITEM: 3 refills | Status: DC

## 2020-10-01 NOTE — Telephone Encounter (Signed)
Prescription for anti-fungal nail solution faxed to [facility]

## 2021-06-10 ENCOUNTER — Other Ambulatory Visit: Payer: Self-pay | Admitting: Family Medicine

## 2021-06-10 DIAGNOSIS — Z1231 Encounter for screening mammogram for malignant neoplasm of breast: Secondary | ICD-10-CM

## 2021-07-14 ENCOUNTER — Other Ambulatory Visit: Payer: Self-pay

## 2021-07-14 ENCOUNTER — Ambulatory Visit
Admission: RE | Admit: 2021-07-14 | Discharge: 2021-07-14 | Disposition: A | Discharge status: Home / Self Care | Payer: No Typology Code available for payment source | Source: Ambulatory Visit | Attending: Family Medicine | Admitting: Family Medicine

## 2021-07-14 DIAGNOSIS — Z1231 Encounter for screening mammogram for malignant neoplasm of breast: Secondary | ICD-10-CM

## 2021-07-19 ENCOUNTER — Other Ambulatory Visit: Payer: Self-pay | Admitting: Family Medicine

## 2021-07-19 DIAGNOSIS — R928 Other abnormal and inconclusive findings on diagnostic imaging of breast: Secondary | ICD-10-CM

## 2021-08-26 ENCOUNTER — Ambulatory Visit: Admission: RE | Admit: 2021-08-26 | Payer: No Typology Code available for payment source | Source: Ambulatory Visit

## 2021-08-26 ENCOUNTER — Ambulatory Visit
Admission: RE | Admit: 2021-08-26 | Discharge: 2021-08-26 | Disposition: A | Discharge status: Home / Self Care | Payer: No Typology Code available for payment source | Source: Ambulatory Visit | Attending: Family Medicine | Admitting: Family Medicine

## 2021-08-26 DIAGNOSIS — R928 Other abnormal and inconclusive findings on diagnostic imaging of breast: Secondary | ICD-10-CM

## 2022-04-14 ENCOUNTER — Ambulatory Visit: Payer: No Typology Code available for payment source | Admitting: Podiatry

## 2022-04-14 ENCOUNTER — Ambulatory Visit (INDEPENDENT_AMBULATORY_CARE_PROVIDER_SITE_OTHER): Payer: No Typology Code available for payment source

## 2022-04-14 DIAGNOSIS — M7731 Calcaneal spur, right foot: Secondary | ICD-10-CM | POA: Diagnosis not present

## 2022-04-14 DIAGNOSIS — B351 Tinea unguium: Secondary | ICD-10-CM | POA: Diagnosis not present

## 2022-04-14 DIAGNOSIS — M79671 Pain in right foot: Secondary | ICD-10-CM

## 2022-04-14 DIAGNOSIS — M7661 Achilles tendinitis, right leg: Secondary | ICD-10-CM | POA: Diagnosis not present

## 2022-04-14 DIAGNOSIS — M21619 Bunion of unspecified foot: Secondary | ICD-10-CM | POA: Diagnosis not present

## 2022-04-14 NOTE — Progress Notes (Unsigned)
Subjective: Chief Complaint  Patient presents with   Foot Pain    Patient came in today for right tendon pain which started 10 mths ago, the pain has since gone away, Patient denies any pain, TX: Icing, stretching,  patient also has a callus on the left foot, and left foot toe fungus, TX: fungi nail, X-Rays taken today    51 year old female presents for above concerns.  She states that she been trying to exercise more around December.  She has been experiencing discomfort on the Achilles tendon.  She is a hurts more when she took the pressure off of her foot.  She states that when she wear 3 and she feels it helped.  She states that she rested it for some time and about 6 to 8 weeks ago she started to "test it out" and try become more active.  She wants to get back to more exercise.  No injuries that she reports.  Also states that she still has fungus on her left big toenail.  No pain currently.  No swelling redness or any drainage.   Objective: AAO x3, NAD DP/PT pulses palpable bilaterally, CRT less than 3 seconds Bunion present on left side resulting in hyperkeratotic lesion.  No ongoing ulceration drainage or signs of infection. Left hallux nail to be mildly hypertrophic, dystrophic with yellow, brown discoloration.  No edema, erythema or signs of infection.  No open lesions. Significant tenderness on the Achilles tendon on the right side, able to palpate any area pinpoint tenderness and there is no deficit noted in the Achilles tendon.  Equinus is present.  No edema to the area.  Negative Tinel sign.  MMT 5/5. No pain with calf compression, swelling, warmth, erythema  Assessment: 51 year old female with Achilles tendinitis, onychomycosis, bunion  Plan: -All treatment options discussed with the patient including all alternatives, risks, complications.  -X-rays obtained and reviewed.  3 views of the right foot and 2 views of the right ankle were obtained.  There is an increased first  metatarsal angle.  Significant calcaneal spurring and calcifications noted within the distal Achilles tendon.  No evidence of acute fracture. -Regards to the Achilles tendon we discussed stretching, icing on a regular basis.  Discussed shoes and good arch support.  Discussed gradual increase activity level but holding off on high impact activities.  Make sure she is stretching before and after activity and good arch supports will hopefully be helpful.  She can use a heel lift in her shoe as needed.  This was dispensed today. -Debrided the callus with any complications or bleeding.  Offloading, moisturizer. -Sharply debrided the nails today with any complications or bleeding.  I did culture this today.  This was sent to [facility].  -Patient encouraged to call the office with any questions, concerns, change in symptoms.

## 2022-04-14 NOTE — Patient Instructions (Addendum)

## 2022-04-20 ENCOUNTER — Telehealth: Payer: Self-pay | Admitting: Podiatry

## 2022-04-20 NOTE — Telephone Encounter (Signed)
[name]- Please let the patient know that the culture did show fungus. The best option for fungus is to do oral Lamisil but we would need to check blood work prior to starting to make sure liver function is normal as this medication can affect the liver. If he did not want to do oral medication then we can try topical. Thanks.

## 2022-04-25 ENCOUNTER — Other Ambulatory Visit: Payer: Self-pay

## 2022-04-25 DIAGNOSIS — Z79899 Other long term (current) drug therapy: Secondary | ICD-10-CM

## 2022-05-03 ENCOUNTER — Other Ambulatory Visit: Payer: Self-pay | Admitting: Podiatry

## 2022-05-03 DIAGNOSIS — M7731 Calcaneal spur, right foot: Secondary | ICD-10-CM

## 2022-06-26 ENCOUNTER — Other Ambulatory Visit (HOSPITAL_COMMUNITY): Payer: Self-pay | Admitting: Family Medicine

## 2022-06-26 DIAGNOSIS — E782 Mixed hyperlipidemia: Secondary | ICD-10-CM

## 2022-07-24 IMAGING — MG MM DIGITAL DIAGNOSTIC UNILAT*L* W/ TOMO W/ CAD
6 series · 6 of 18 positions shown · non-contrast
Comparison: Previous exam(s).

CLINICAL DATA: 50-year-old female presenting as a recall from
screening for possible left breast asymmetry.

EXAM:
DIGITAL DIAGNOSTIC UNILATERAL LEFT MAMMOGRAM WITH TOMOSYNTHESIS AND
CAD
TECHNIQUE: Left digital diagnostic mammography and breast tomosynthesis was
performed. The images were evaluated with computer-aided detection.

[L CC synth-2D]
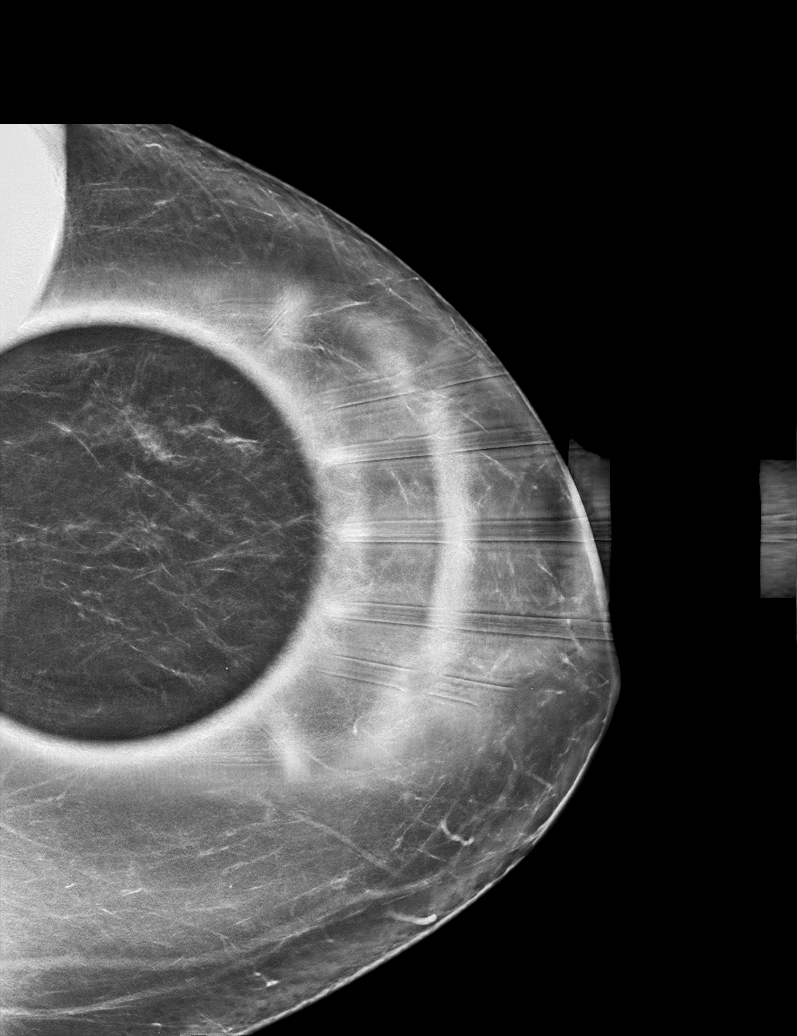

[L MLO synth-2D (1 of 2)]
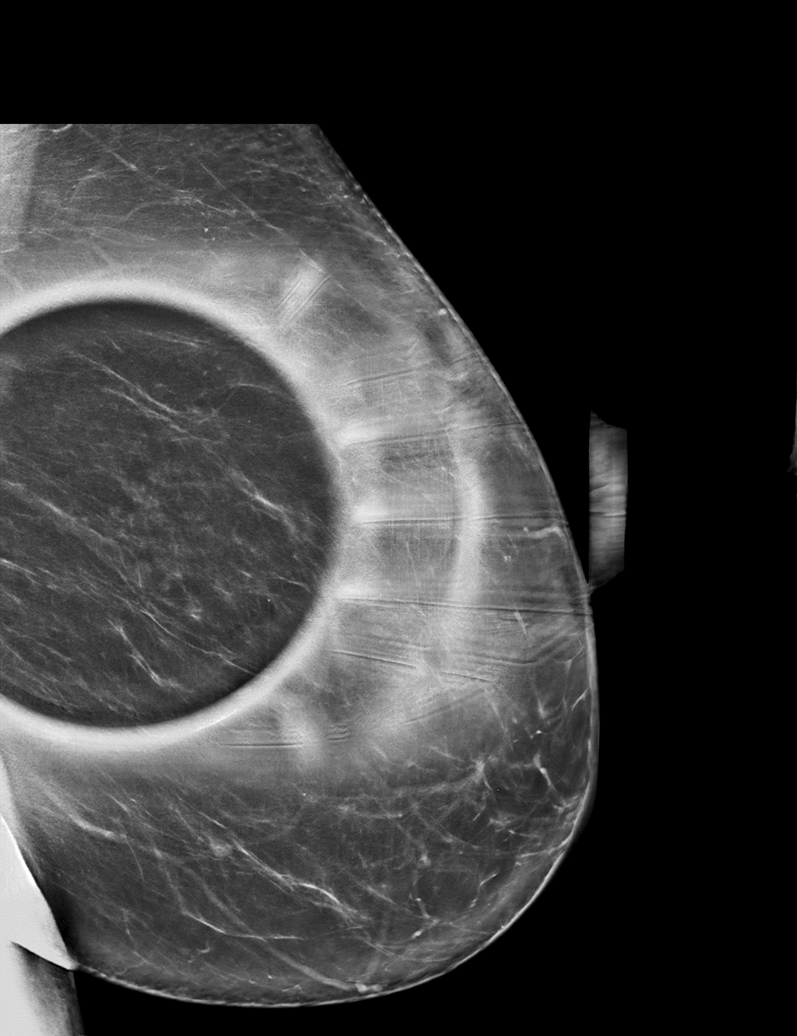

[L MLO synth-2D (2 of 2)]
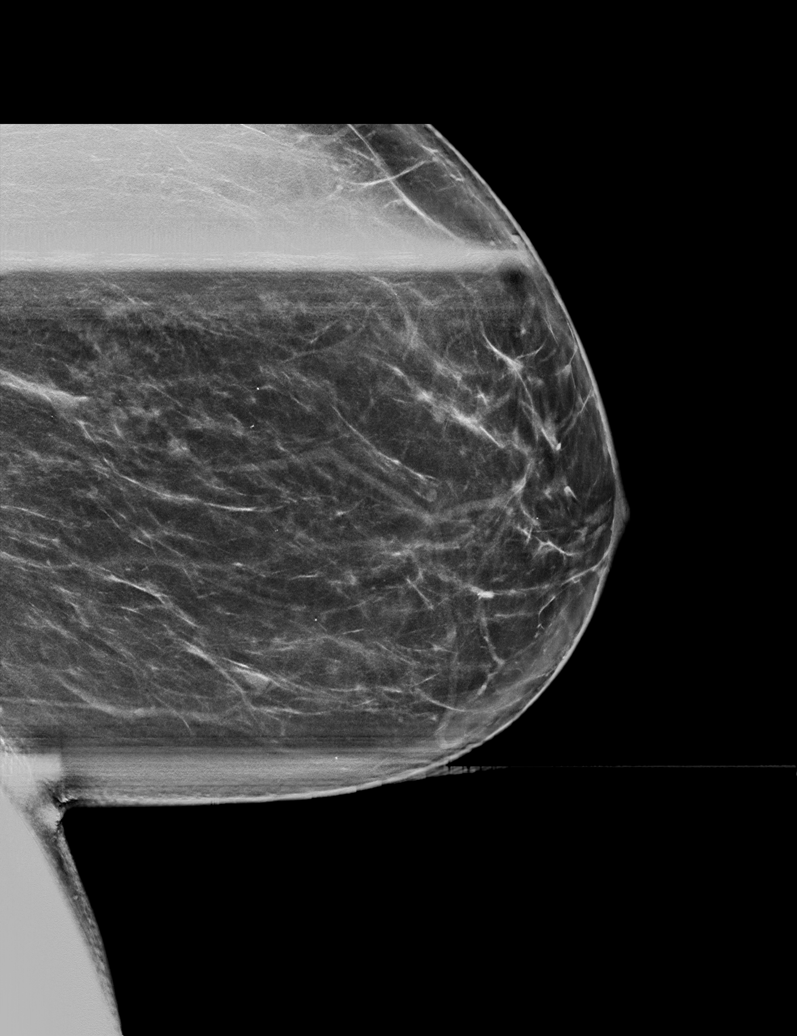

[L MLO tomo (1 of 2) · tomo slice 43/85.0]
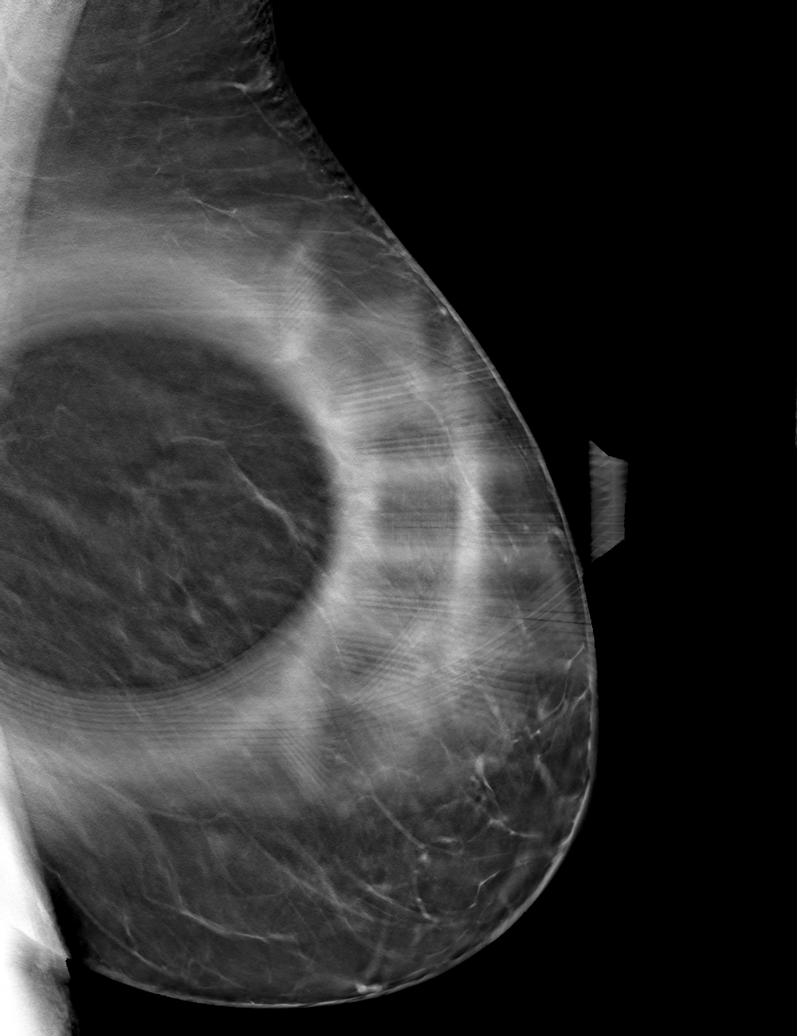

[L CC tomo · tomo slice 37/73.0]
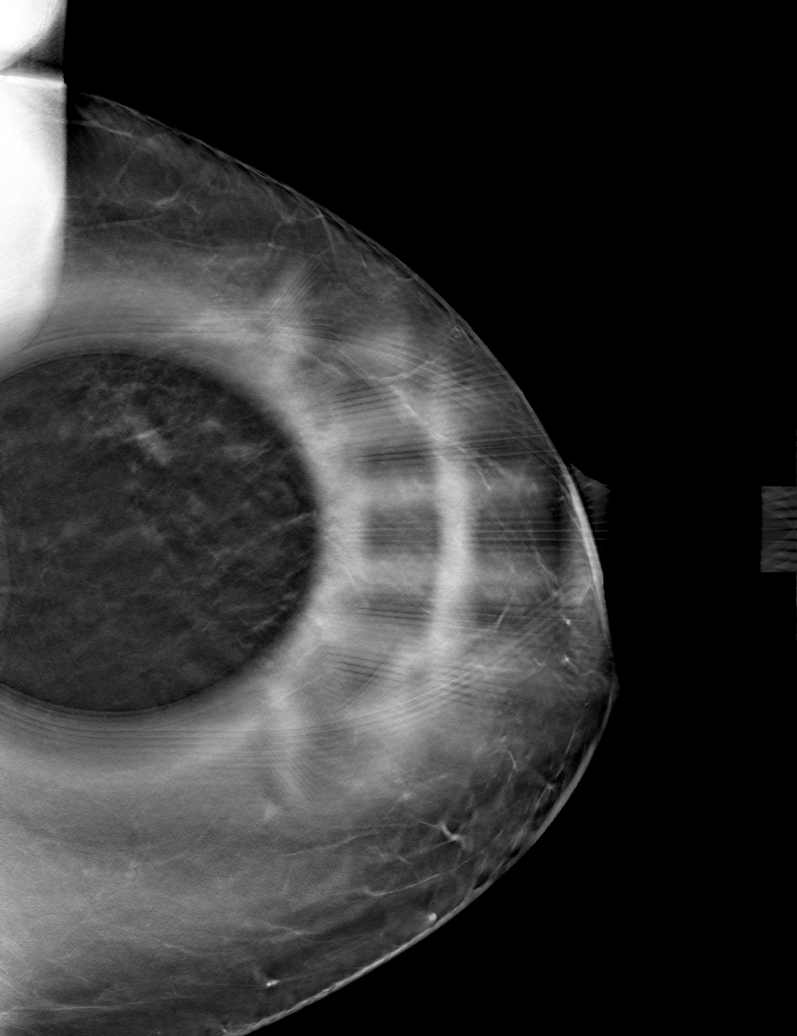

[L MLO tomo (2 of 2) · tomo slice 40/79.0]
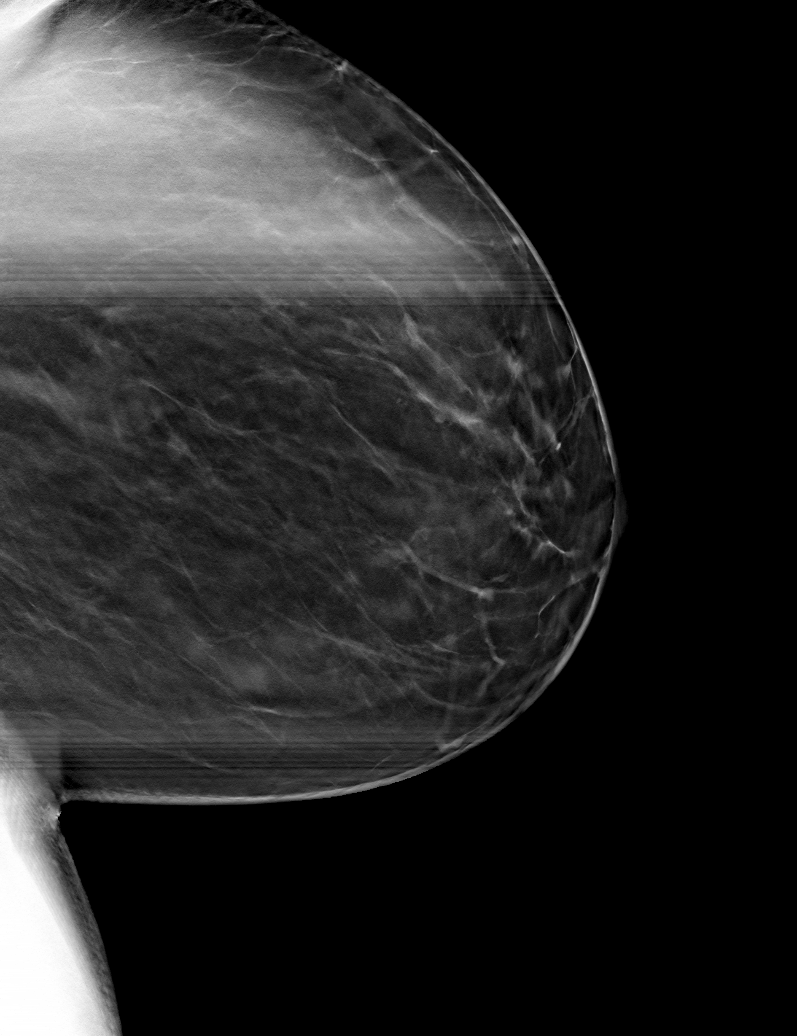

[6 of 18 positions shown; findings below may reference images not displayed]

ACR Breast Density Category b: There are scattered areas of
fibroglandular density.
FINDINGS: Spot compression tomosynthesis CC views of the left breast were
performed for a questioned asymmetry in the outer left breast. On
the additional imaging the asymmetry partially effaces and is stable
dating back to at least 4358, consistent with a benign process.
There is no new suspicious mass or distortion. No new findings
elsewhere in the left breast.
IMPRESSION: Stable benign asymmetry in the outer left breast.

RECOMMENDATION:
Screening mammogram in one year.(Code:O8-T-G1I)

I have discussed the findings and recommendations with the patient.
If applicable, a reminder letter will be sent to the patient
regarding the next appointment.

BI-RADS CATEGORY  2: Benign.

## 2023-02-21 ENCOUNTER — Ambulatory Visit (HOSPITAL_BASED_OUTPATIENT_CLINIC_OR_DEPARTMENT_OTHER)
Admission: RE | Admit: 2023-02-21 | Discharge: 2023-02-21 | Disposition: A | Discharge status: Home / Self Care | Payer: No Typology Code available for payment source | Source: Ambulatory Visit | Attending: Family Medicine | Admitting: Family Medicine

## 2023-02-21 ENCOUNTER — Other Ambulatory Visit (HOSPITAL_BASED_OUTPATIENT_CLINIC_OR_DEPARTMENT_OTHER): Payer: Self-pay | Admitting: Family Medicine

## 2023-02-21 DIAGNOSIS — M25561 Pain in right knee: Secondary | ICD-10-CM | POA: Insufficient documentation

## 2023-03-27 ENCOUNTER — Other Ambulatory Visit (HOSPITAL_COMMUNITY)
Admission: RE | Admit: 2023-03-27 | Discharge: 2023-03-27 | Disposition: A | Discharge status: Home / Self Care | Payer: No Typology Code available for payment source | Source: Ambulatory Visit | Attending: Family Medicine | Admitting: Family Medicine

## 2023-03-27 DIAGNOSIS — Z01419 Encounter for gynecological examination (general) (routine) without abnormal findings: Secondary | ICD-10-CM | POA: Diagnosis present

## 2023-04-02 LAB — CYTOLOGY - PAP
Comment: NEGATIVE
Diagnosis: NEGATIVE
High risk HPV: NEGATIVE

## 2023-04-03 ENCOUNTER — Other Ambulatory Visit: Payer: Self-pay | Admitting: Family Medicine

## 2023-04-03 DIAGNOSIS — N939 Abnormal uterine and vaginal bleeding, unspecified: Secondary | ICD-10-CM

## 2023-04-13 ENCOUNTER — Ambulatory Visit
Admission: RE | Admit: 2023-04-13 | Discharge: 2023-04-13 | Disposition: A | Discharge status: Home / Self Care | Payer: No Typology Code available for payment source | Source: Ambulatory Visit | Attending: Family Medicine | Admitting: Family Medicine

## 2023-04-13 DIAGNOSIS — N939 Abnormal uterine and vaginal bleeding, unspecified: Secondary | ICD-10-CM

## 2023-09-14 ENCOUNTER — Other Ambulatory Visit: Payer: Self-pay | Admitting: Family Medicine

## 2023-09-14 DIAGNOSIS — Z1231 Encounter for screening mammogram for malignant neoplasm of breast: Secondary | ICD-10-CM

## 2023-10-04 ENCOUNTER — Ambulatory Visit
Admission: RE | Admit: 2023-10-04 | Discharge: 2023-10-04 | Disposition: A | Discharge status: Home / Self Care | Payer: No Typology Code available for payment source | Source: Ambulatory Visit | Attending: Family Medicine | Admitting: Family Medicine

## 2023-10-04 DIAGNOSIS — Z1231 Encounter for screening mammogram for malignant neoplasm of breast: Secondary | ICD-10-CM

## 2023-10-22 ENCOUNTER — Emergency Department (HOSPITAL_BASED_OUTPATIENT_CLINIC_OR_DEPARTMENT_OTHER)

## 2023-10-22 ENCOUNTER — Encounter (HOSPITAL_BASED_OUTPATIENT_CLINIC_OR_DEPARTMENT_OTHER): Payer: Self-pay | Admitting: Emergency Medicine

## 2023-10-22 ENCOUNTER — Emergency Department (HOSPITAL_BASED_OUTPATIENT_CLINIC_OR_DEPARTMENT_OTHER)
Admission: EM | Admit: 2023-10-22 | Discharge: 2023-10-22 | Disposition: A | Discharge status: Home / Self Care | Attending: Emergency Medicine | Admitting: Emergency Medicine

## 2023-10-22 ENCOUNTER — Other Ambulatory Visit: Payer: Self-pay

## 2023-10-22 DIAGNOSIS — E860 Dehydration: Secondary | ICD-10-CM

## 2023-10-22 DIAGNOSIS — N83202 Unspecified ovarian cyst, left side: Secondary | ICD-10-CM

## 2023-10-22 DIAGNOSIS — K8020 Calculus of gallbladder without cholecystitis without obstruction: Secondary | ICD-10-CM

## 2023-10-22 DIAGNOSIS — D259 Leiomyoma of uterus, unspecified: Secondary | ICD-10-CM | POA: Diagnosis not present

## 2023-10-22 DIAGNOSIS — R1084 Generalized abdominal pain: Secondary | ICD-10-CM

## 2023-10-22 DIAGNOSIS — R1010 Upper abdominal pain, unspecified: Secondary | ICD-10-CM | POA: Diagnosis present

## 2023-10-22 DIAGNOSIS — R112 Nausea with vomiting, unspecified: Secondary | ICD-10-CM

## 2023-10-22 LAB — COMPREHENSIVE METABOLIC PANEL
ALT: 36 U/L (ref 0–44)
AST: 18 U/L (ref 15–41)
Albumin: 4.4 g/dL (ref 3.5–5.0)
Alkaline Phosphatase: 73 U/L (ref 38–126)
Anion gap: 10 (ref 5–15)
BUN: 19 mg/dL (ref 6–20)
CO2: 26 mmol/L (ref 22–32)
Calcium: 9.1 mg/dL (ref 8.9–10.3)
Chloride: 102 mmol/L (ref 98–111)
Creatinine, Ser: 0.74 mg/dL (ref 0.44–1.00)
GFR, Estimated: >60 mL/min (ref >60)
Glucose, Bld: 161 mg/dL — ABNORMAL HIGH (ref 70–99)
Potassium: 3.6 mmol/L (ref 3.5–5.1)
Sodium: 138 mmol/L (ref 135–145)
Total Bilirubin: 0.5 mg/dL (ref 0.0–1.2)
Total Protein: 7.3 g/dL (ref 6.5–8.1)

## 2023-10-22 LAB — URINALYSIS, ROUTINE W REFLEX MICROSCOPIC
Bilirubin Urine: NEGATIVE
Glucose, UA: NEGATIVE mg/dL
Hgb urine dipstick: NEGATIVE
Ketones, ur: 15 mg/dL — AB
Leukocytes,Ua: NEGATIVE
Nitrite: NEGATIVE
Protein, ur: 30 mg/dL — AB
Specific Gravity, Urine: >1.046 — ABNORMAL HIGH (ref 1.005–1.030)
pH: 7 (ref 5.0–8.0)

## 2023-10-22 LAB — CBC
HCT: 41.6 % (ref 36.0–46.0)
Hemoglobin: 14.1 g/dL (ref 12.0–15.0)
MCH: 28.6 pg (ref 26.0–34.0)
MCHC: 33.9 g/dL (ref 30.0–36.0)
MCV: 84.4 fL (ref 80.0–100.0)
Platelets: 257 10*3/uL (ref 150–400)
RBC: 4.93 MIL/uL (ref 3.87–5.11)
RDW: 14.1 % (ref 11.5–15.5)
WBC: 9.1 10*3/uL (ref 4.0–10.5)
nRBC: 0 % (ref 0.0–0.2)

## 2023-10-22 LAB — RESP PANEL BY RT-PCR (RSV, FLU A&B, COVID)  RVPGX2
Influenza A by PCR: NEGATIVE
Influenza B by PCR: NEGATIVE
Resp Syncytial Virus by PCR: NEGATIVE
SARS Coronavirus 2 by RT PCR: NEGATIVE

## 2023-10-22 LAB — PREGNANCY, URINE: Preg Test, Ur: NEGATIVE

## 2023-10-22 LAB — LIPASE, BLOOD: Lipase: 13 U/L (ref 11–51)

## 2023-10-22 LAB — TROPONIN I (HIGH SENSITIVITY): Troponin I (High Sensitivity): <2 ng/L (ref <18)

## 2023-10-22 MED ORDER — IOHEXOL 300 MG/ML  SOLN
100.0000 mL | Freq: Once | INTRAMUSCULAR | Status: AC | PRN
  Administered 2023-10-22: 100 mL via INTRAVENOUS

## 2023-10-22 MED ORDER — LACTATED RINGERS IV BOLUS: 1000.0000 mL | Freq: Once | INTRAVENOUS | Status: DC

## 2023-10-22 MED ORDER — ONDANSETRON 8 MG PO TBDP: 8.0000 mg | ORAL_TABLET | Freq: Three times a day (TID) | ORAL | 0 refills | Status: AC | PRN

## 2023-10-22 MED ORDER — LACTATED RINGERS IV BOLUS
1000.0000 mL | Freq: Once | INTRAVENOUS | Status: AC
  Administered 2023-10-22: 1000 mL via INTRAVENOUS

## 2023-10-22 MED ORDER — ONDANSETRON HCL 4 MG/2ML IJ SOLN
4.0000 mg | Freq: Once | INTRAMUSCULAR | Status: AC
  Administered 2023-10-22: 4 mg via INTRAVENOUS
  Filled 2023-10-22: qty 2

## 2023-10-22 MED ORDER — MORPHINE SULFATE (PF) 4 MG/ML IV SOLN
4.0000 mg | Freq: Once | INTRAVENOUS | Status: AC
  Administered 2023-10-22: 4 mg via INTRAVENOUS
  Filled 2023-10-22: qty 1

## 2023-10-22 NOTE — ED Provider Notes (Signed)
 [facility] Provider Note   CSN: [id number] Arrival date & time: 10/22/23  1146     History  Chief Complaint  Patient presents with   Abdominal Pain    Rachael Moran is a 53 y.o. female.  Pt with nausea, vomiting, diarrhea, and abd cramping/pain in past day. Symptoms onset early this AM, vomited several times - no bloody or bilious emesis. No abd distension. Crampy, intermittent, diffuse abd discomfort - no constant, focal, or severe abdominal pain. Had one loose to watery bm, not bloody. No specific known ill contacts or bad food ingestion. No recent change in meds.  No GLP meds. No hx pud, pancreatitis or gallstones. No fever/chills. In past 1-2 days, some sneezing, non prod cough. No chest pain or sob.   The history is provided by the patient and a relative.  Abdominal Pain Associated symptoms: cough, diarrhea, nausea and vomiting   Associated symptoms: no chest pain, no chills, no dysuria, no fever, no shortness of breath, no sore throat and no vaginal bleeding        Home Medications Prior to Admission medications   Medication Sig Start Date End Date Taking? Authorizing Provider  amLODipine (NORVASC) 5 MG tablet Take 5 mg by mouth daily. 06/07/20   [provider]  hydrochlorothiazide (HYDRODIURIL) 25 MG tablet Take 25 mg by mouth daily. 06/04/20   [provider]      Allergies    Lisinopril    Review of Systems   Review of Systems  Constitutional:  Negative for chills and fever.  HENT:  Positive for congestion. Negative for sore throat.   Eyes:  Negative for redness.  Respiratory:  Positive for cough. Negative for shortness of breath.   Cardiovascular:  Negative for chest pain and leg swelling.  Gastrointestinal:  Positive for abdominal pain, diarrhea, nausea and vomiting.  Genitourinary:  Negative for dysuria, flank pain and vaginal bleeding.  Musculoskeletal:  Negative for back pain and neck pain.   Skin:  Negative for rash.  Neurological:  Negative for headaches.    Physical Exam Updated Vital Signs BP (!) 106/46   Pulse 96   Temp 98.6 F (37 C)   Resp 18   SpO2 93%  Physical Exam Vitals and nursing note reviewed.  Constitutional:      Appearance: Normal appearance. She is well-developed.  HENT:     Head: Atraumatic.     Nose: Nose normal.     Mouth/Throat:     Mouth: Mucous membranes are moist.     Pharynx: Oropharynx is clear. No oropharyngeal exudate or posterior oropharyngeal erythema.  Eyes:     General: No scleral icterus.    Conjunctiva/sclera: Conjunctivae normal.  Neck:     Trachea: No tracheal deviation.     Comments: No stiffness or rigidity.  Cardiovascular:     Rate and Rhythm: Normal rate and regular rhythm.     Pulses: Normal pulses.     Heart sounds: Normal heart sounds. No murmur heard.    No friction rub. No gallop.  Pulmonary:     Effort: Pulmonary effort is normal. No respiratory distress.     Breath sounds: Normal breath sounds.  Abdominal:     General: Bowel sounds are normal. There is no distension.     Palpations: Abdomen is soft. There is no mass.     Tenderness: There is no abdominal tenderness. There is no guarding.  Genitourinary:    Comments: No cva tenderness.  Musculoskeletal:        General: No swelling or tenderness.     Cervical back: Normal range of motion and neck supple. No rigidity. No muscular tenderness.     Right lower leg: No edema.     Left lower leg: No edema.  Lymphadenopathy:     Cervical: No cervical adenopathy.  Skin:    General: Skin is warm and dry.     Findings: No rash.  Neurological:     Mental Status: She is alert.     Comments: Alert, speech normal.   Psychiatric:        Mood and Affect: Mood normal.     ED Results / Procedures / Treatments   Labs (all labs ordered are listed, but only abnormal results are displayed) Results for orders placed or performed during the hospital encounter of  10/22/23  Resp panel by RT-PCR (RSV, Flu A&B, Covid) Anterior Nasal Swab   Collection Time: 10/22/23  1:00 PM   Specimen: Anterior Nasal Swab  Result Value Ref Range   SARS Coronavirus 2 by RT PCR NEGATIVE NEGATIVE   Influenza A by PCR NEGATIVE NEGATIVE   Influenza B by PCR NEGATIVE NEGATIVE   Resp Syncytial Virus by PCR NEGATIVE NEGATIVE  CBC   Collection Time: 10/22/23  1:00 PM  Result Value Ref Range   WBC 9.1 4.0 - 10.5 K/uL   RBC 4.93 3.87 - 5.11 MIL/uL   Hemoglobin 14.1 12.0 - 15.0 g/dL   HCT 16.1 09.6 - 04.5 %   MCV 84.4 80.0 - 100.0 fL   MCH 28.6 26.0 - 34.0 pg   MCHC 33.9 30.0 - 36.0 g/dL   RDW 40.9 81.1 - 91.4 %   Platelets 257 150 - 400 K/uL   nRBC 0.0 0.0 - 0.2 %  Comprehensive metabolic panel   Collection Time: 10/22/23  1:00 PM  Result Value Ref Range   Sodium 138 135 - 145 mmol/L   Potassium 3.6 3.5 - 5.1 mmol/L   Chloride 102 98 - 111 mmol/L   CO2 26 22 - 32 mmol/L   Glucose, Bld 161 (H) 70 - 99 mg/dL   BUN 19 6 - 20 mg/dL   Creatinine, Ser 7.82 0.44 - 1.00 mg/dL   Calcium 9.1 8.9 - 95.6 mg/dL   Total Protein 7.3 6.5 - 8.1 g/dL   Albumin 4.4 3.5 - 5.0 g/dL   AST 18 15 - 41 U/L   ALT 36 0 - 44 U/L   Alkaline Phosphatase 73 38 - 126 U/L   Total Bilirubin 0.5 0.0 - 1.2 mg/dL   GFR, Estimated >21 >30 mL/min   Anion gap 10 5 - 15  Lipase, blood   Collection Time: 10/22/23  1:00 PM  Result Value Ref Range   Lipase 13 11 - 51 U/L  Troponin I (High Sensitivity)   Collection Time: 10/22/23  1:41 PM  Result Value Ref Range   Troponin I (High Sensitivity) <2 <18 ng/L  Urinalysis, Routine w reflex microscopic -Urine, Clean Catch   Collection Time: 10/22/23  4:19 PM  Result Value Ref Range   Color, Urine YELLOW YELLOW   APPearance CLEAR CLEAR   Specific Gravity, Urine >1.046 (H) 1.005 - 1.030   pH 7.0 5.0 - 8.0   Glucose, UA NEGATIVE NEGATIVE mg/dL   Hgb urine dipstick NEGATIVE NEGATIVE   Bilirubin Urine NEGATIVE NEGATIVE   Ketones, ur 15 (A) NEGATIVE  mg/dL   Protein, ur 30 (A) NEGATIVE mg/dL   Nitrite  NEGATIVE NEGATIVE   Leukocytes,Ua NEGATIVE NEGATIVE   RBC / HPF 0-5 0 - 5 RBC/hpf   WBC, UA 0-5 0 - 5 WBC/hpf   Bacteria, UA RARE (A) NONE SEEN   Squamous Epithelial / HPF 0-5 0 - 5 /HPF   Mucus PRESENT    Hyaline Casts, UA PRESENT   Pregnancy, urine   Collection Time: 10/22/23  4:19 PM  Result Value Ref Range   Preg Test, Ur NEGATIVE NEGATIVE   US PELVIC COMPLETE WITH TRANSVAGINAL Result Date: 10/22/2023 CLINICAL DATA:  Generalized abdominal pain EXAM: TRANSABDOMINAL AND TRANSVAGINAL ULTRASOUND OF PELVIS TECHNIQUE: Both transabdominal and transvaginal ultrasound examinations of the pelvis were performed. Transabdominal technique was performed for global imaging of the pelvis including uterus, ovaries, adnexal regions, and pelvic cul-de-sac. COMPARISON:  CT abdomen pelvis earlier today FINDINGS: Uterus Measurements: 6.9 x 3.9 x 3.6 cm = volume: 50 mL. Within the anterior uterus there is a 2.3 x 1.6 x 3.1 cm mass likely representing a fibroid. Endometrium Thickness: 9 mm.  No focal abnormality visualized. Right ovary Not well visualized. Left ovary Measurements: 3.4 x 1.6 x 2.0 cm = volume: 5.6 mL. Complex hyperechoic area left of the uterus adjacent to the left adnexa measuring 3.9 x 1.8 x 3.2 cm. Other findings Exam is compromised by body habitus and overlying bowel. No abnormal free fluid. IMPRESSION: 1. Indeterminate complex hyperechoic area confluent with the borders of the left uterus and adnexa measuring 3.9 x 1.8 x 3.2 cm. This could represent a dilated fallopian tube containing blood products or purulent material. Uterine or adnexal mass is not excluded. Given the limitations of this study, recommend further evaluation with pelvic MRI with and without IV contrast. 2. Right ovary not well visualized. Electronically Signed   By: Minerva Fester M.D.   On: 10/22/2023 19:48   US Abdomen Limited RUQ (LIVER/GB) Result Date: 10/22/2023 CLINICAL  DATA:  Abdominal pain. EXAM: ULTRASOUND ABDOMEN LIMITED RIGHT UPPER QUADRANT COMPARISON:  CT abdomen pelvis dated 10/22/2023. FINDINGS: Gallbladder: Multiple gallstones. No gallbladder wall thickening or pericholecystic fluid. Negative sonographic Murphy's sign. Common bile duct: Diameter: 6 mm Liver: There is diffuse increased liver echogenicity most commonly seen in the setting of fatty infiltration. Superimposed inflammation or fibrosis is not excluded. Clinical correlation is recommended. Portal vein is patent on color Doppler imaging with normal direction of blood flow towards the liver. Other: None. IMPRESSION: 1. Cholelithiasis without sonographic evidence of acute cholecystitis. 2. Fatty liver. Electronically Signed   By: Elgie Collard M.D.   On: 10/22/2023 18:44   CT ABDOMEN PELVIS W CONTRAST Result Date: 10/22/2023 CLINICAL DATA:  Acute abdominal pain beginning this morning. Cough ground emesis. EXAM: CT ABDOMEN AND PELVIS WITH CONTRAST TECHNIQUE: Multidetector CT imaging of the abdomen and pelvis was performed using the standard protocol following bolus administration of intravenous contrast. RADIATION DOSE REDUCTION: This exam was performed according to the departmental dose-optimization program which includes automated exposure control, adjustment of the mA and/or kV according to patient size and/or use of iterative reconstruction technique. CONTRAST:  OMNIPAQUE IOHEXOL 300 MG/ML  SOLN COMPARISON:  None Available. FINDINGS: Lower Chest: No acute findings. Hepatobiliary: No suspicious hepatic masses identified. Mild diffuse hepatic steatosis noted. Numerous small gallstones are seen, without signs of cholecystitis or biliary ductal dilatation. Pancreas:  No mass or inflammatory changes. Spleen: Within normal limits in size and appearance. Adrenals/Urinary Tract: No suspicious masses identified. No evidence of ureteral calculi or hydronephrosis. Stomach/Bowel: No evidence of obstruction,  inflammatory process or abnormal  fluid collections. Normal appendix visualized. Diverticulosis is seen mainly involving the sigmoid colon, however there is no evidence of diverticulitis. Vascular/Lymphatic: No pathologically enlarged lymph nodes. No acute vascular findings. Reproductive: Normal appearance of uterus. A small serpiginous cystic area is seen in the left adnexa, highly suspicious for a mild hydrosalpinx. No No evidence of inflammatory changes or free fluid. Other:  None. Musculoskeletal:  No suspicious bone lesions identified. IMPRESSION: No acute findings. Colonic diverticulosis, without radiographic evidence of diverticulitis. Cholelithiasis. No radiographic evidence of cholecystitis. Mild hepatic steatosis. Suspect mild left hydrosalpinx. Pelvic ultrasound could be performed for further evaluation. Electronically Signed   By: Danae Orleans M.D.   On: 10/22/2023 15:57   MM 3D SCREENING MAMMOGRAM BILATERAL BREAST Result Date: 10/09/2023 CLINICAL DATA:  Screening. EXAM: DIGITAL SCREENING BILATERAL MAMMOGRAM WITH TOMOSYNTHESIS AND CAD TECHNIQUE: Bilateral screening digital craniocaudal and mediolateral oblique mammograms were obtained. Bilateral screening digital breast tomosynthesis was performed. The images were evaluated with computer-aided detection. COMPARISON:  Previous exam(s). ACR Breast Density Category b: There are scattered areas of fibroglandular density. FINDINGS: There are no findings suspicious for malignancy. IMPRESSION: No mammographic evidence of malignancy. A result letter of this screening mammogram will be mailed directly to the patient. RECOMMENDATION: Screening mammogram in one year. (Code:SM-B-01Y) BI-RADS CATEGORY  1: Negative. Electronically Signed   By: Emmaline Kluver M.D.   On: 10/09/2023 11:45     EKG EKG Interpretation Date/Time:  Monday October 22 2023 12:47:42 EDT Ventricular Rate:  97 PR Interval:  168 QRS Duration:  94 QT Interval:  349 QTC  Calculation: 444 R Axis:   67  Text Interpretation: Sinus rhythm Nonspecific ST abnormality Confirmed by Cathren Laine ([id number]) on 10/22/2023 1:39:54 PM  Radiology US PELVIC COMPLETE WITH TRANSVAGINAL Result Date: 10/22/2023 CLINICAL DATA:  Generalized abdominal pain EXAM: TRANSABDOMINAL AND TRANSVAGINAL ULTRASOUND OF PELVIS TECHNIQUE: Both transabdominal and transvaginal ultrasound examinations of the pelvis were performed. Transabdominal technique was performed for global imaging of the pelvis including uterus, ovaries, adnexal regions, and pelvic cul-de-sac. COMPARISON:  CT abdomen pelvis earlier today FINDINGS: Uterus Measurements: 6.9 x 3.9 x 3.6 cm = volume: 50 mL. Within the anterior uterus there is a 2.3 x 1.6 x 3.1 cm mass likely representing a fibroid. Endometrium Thickness: 9 mm.  No focal abnormality visualized. Right ovary Not well visualized. Left ovary Measurements: 3.4 x 1.6 x 2.0 cm = volume: 5.6 mL. Complex hyperechoic area left of the uterus adjacent to the left adnexa measuring 3.9 x 1.8 x 3.2 cm. Other findings Exam is compromised by body habitus and overlying bowel. No abnormal free fluid. IMPRESSION: 1. Indeterminate complex hyperechoic area confluent with the borders of the left uterus and adnexa measuring 3.9 x 1.8 x 3.2 cm. This could represent a dilated fallopian tube containing blood products or purulent material. Uterine or adnexal mass is not excluded. Given the limitations of this study, recommend further evaluation with pelvic MRI with and without IV contrast. 2. Right ovary not well visualized. Electronically Signed   By: Minerva Fester M.D.   On: 10/22/2023 19:48   US Abdomen Limited RUQ (LIVER/GB) Result Date: 10/22/2023 CLINICAL DATA:  Abdominal pain. EXAM: ULTRASOUND ABDOMEN LIMITED RIGHT UPPER QUADRANT COMPARISON:  CT abdomen pelvis dated 10/22/2023. FINDINGS: Gallbladder: Multiple gallstones. No gallbladder wall thickening or pericholecystic fluid. Negative sonographic  Murphy's sign. Common bile duct: Diameter: 6 mm Liver: There is diffuse increased liver echogenicity most commonly seen in the setting of fatty infiltration. Superimposed inflammation or fibrosis is not excluded. Clinical  correlation is recommended. Portal vein is patent on color Doppler imaging with normal direction of blood flow towards the liver. Other: None. IMPRESSION: 1. Cholelithiasis without sonographic evidence of acute cholecystitis. 2. Fatty liver. Electronically Signed   By: Elgie Collard M.D.   On: 10/22/2023 18:44   CT ABDOMEN PELVIS W CONTRAST Result Date: 10/22/2023 CLINICAL DATA:  Acute abdominal pain beginning this morning. Cough ground emesis. EXAM: CT ABDOMEN AND PELVIS WITH CONTRAST TECHNIQUE: Multidetector CT imaging of the abdomen and pelvis was performed using the standard protocol following bolus administration of intravenous contrast. RADIATION DOSE REDUCTION: This exam was performed according to the departmental dose-optimization program which includes automated exposure control, adjustment of the mA and/or kV according to patient size and/or use of iterative reconstruction technique. CONTRAST:  OMNIPAQUE IOHEXOL 300 MG/ML  SOLN COMPARISON:  None Available. FINDINGS: Lower Chest: No acute findings. Hepatobiliary: No suspicious hepatic masses identified. Mild diffuse hepatic steatosis noted. Numerous small gallstones are seen, without signs of cholecystitis or biliary ductal dilatation. Pancreas:  No mass or inflammatory changes. Spleen: Within normal limits in size and appearance. Adrenals/Urinary Tract: No suspicious masses identified. No evidence of ureteral calculi or hydronephrosis. Stomach/Bowel: No evidence of obstruction, inflammatory process or abnormal fluid collections. Normal appendix visualized. Diverticulosis is seen mainly involving the sigmoid colon, however there is no evidence of diverticulitis. Vascular/Lymphatic: No pathologically enlarged lymph nodes. No  acute vascular findings. Reproductive: Normal appearance of uterus. A small serpiginous cystic area is seen in the left adnexa, highly suspicious for a mild hydrosalpinx. No No evidence of inflammatory changes or free fluid. Other:  None. Musculoskeletal:  No suspicious bone lesions identified. IMPRESSION: No acute findings. Colonic diverticulosis, without radiographic evidence of diverticulitis. Cholelithiasis. No radiographic evidence of cholecystitis. Mild hepatic steatosis. Suspect mild left hydrosalpinx. Pelvic ultrasound could be performed for further evaluation. Electronically Signed   By: Danae Orleans M.D.   On: 10/22/2023 15:57    Procedures Procedures    Medications Ordered in ED Medications  lactated ringers bolus 1,000 mL (0 mLs Intravenous Stopped 10/22/23 1652)  morphine (PF) 4 MG/ML injection 4 mg (4 mg Intravenous Given 10/22/23 1305)  ondansetron (ZOFRAN) injection 4 mg (4 mg Intravenous Given 10/22/23 1304)  iohexol (OMNIPAQUE) 300 MG/ML solution 100 mL (100 mLs Intravenous Contrast Given 10/22/23 1434)  lactated ringers bolus 1,000 mL (1,000 mLs Intravenous New Bag/Given 10/22/23 1950)    ED Course/ Medical Decision Making/ A&P                                 Medical Decision Making Problems Addressed: Cyst of left ovary:    Details: Cystic area left adnexal/tube/ovary Dehydration: acute illness or injury with systemic symptoms that poses a threat to life or bodily functions Gallstones: chronic illness or injury with exacerbation, progression, or side effects of treatment Generalized abdominal pain: acute illness or injury with systemic symptoms that poses a threat to life or bodily functions Nausea and vomiting in adult: acute illness or injury with systemic symptoms that poses a threat to life or bodily functions Uterine leiomyoma, unspecified location: acute illness or injury  Amount and/or Complexity of Data Reviewed Independent Historian:     Details: Family/friend,  hx External Data Reviewed: notes. Labs: ordered. Decision-making details documented in ED Course. Radiology: ordered and independent interpretation performed. Decision-making details documented in ED Course.  Risk Prescription drug management. Parenteral controlled substances. Decision regarding hospitalization.   Iv  ns. Continuous pulse ox and cardiac monitoring. Labs ordered/sent. Imaging ordered.   Differential diagnosis includes dehydration, aki, GE, food poisoning, viral syndrome, etc. Dispo decision including potential need for admission considered - will get labs and imaging and reassess.   Reviewed nursing notes and prior charts for additional history. External reports reviewed. Additional history from: relative.   LR bolus. Morphine iv. Zofran iv.   Cardiac monitor: sinus rhythm, rate 90.  Labs reviewed/interpreted by me - wbc and hgb normal. Chem unremarkable. Lipase normal.  Ua neg for uti, conc urine, +ketones, ivf. Preg neg.   +mid to upper abd tenderness. Will get imaging.   Pt with elev temp noted in ED - rn indicates was done with forehead scan thermometer after patient was resting with heated blankets on forehead immediately prior - rn removed blankets, and recheck and temp normal. Pt denies fever.chills, sweats.   CT reviewed/interpreted by me - gallstones. No sbo. ?prominent left adnexa.   Will get u/s r/o cholecystitis. U/s w gallstones, no cholecystitis. Prom left tube and prob uterine fibroid on u/s. Pt indicates does not get regular periods, denies any vaginal bleeding or discharge, denies any lower abdominal or pelvic pain, and there is no lower abdominal or pelvic tenderness on exam/repeat exam.  Discussed imaging results w patient, and need for outpatient gen surg and gyn follow up.   No recurrent nv. Vitals normal. Abd soft nt.   Pt currently appears stable for d/c.   Rec close pcp/gen surg/gyn  f/u.  Return precautions provided.          Final  Clinical Impression(s) / ED Diagnoses Final diagnoses:  Nausea and vomiting in adult  Generalized abdominal pain    Rx / DC Orders ED Discharge Orders     None         Cathren Laine, MD 10/22/23 2044

## 2023-10-22 NOTE — ED Triage Notes (Signed)
 C/o N/v w/ upper abd pain since this morning around 0200. States she had coffee ground emesis.

## 2023-10-22 NOTE — ED Notes (Signed)
 Pt. Has been on/off sleeping.

## 2023-10-22 NOTE — ED Notes (Signed)
 Patient does not have tempeture of 102.1. Patient had blankets laying on top of face when temporal temp was obtained. Oral temp is 98.5.

## 2023-10-22 NOTE — Discharge Instructions (Addendum)
 It was our pleasure to provide your ER care today - we hope that you feel better.   Drink plenty of fluids/stay well hydrated. Take zofran as need. Follow up closely with primary care doctor in the next 1-2 days if symptoms fail to improve/resolve.  For imaging shows gallstones - follow up with general surgery in 1-2 weeks - call office to arrange appointment.   Your imaging tests also made note of:  Indeterminate complex hyperechoic area confluent with the borders of the left uterus and adnexa measuring 3.9 x 1.8 x 3.2 cm, and within the anterior uterus there is a 2.3 x 1.6 x 3.1 cm mass likely  representing a fibroid.   Given the limitations of this study, recommend further evaluation with pelvic MRI with and without IV contrast. For these findings, follow up closely with a gynecologist in the next 1-2 weeks - see referral - call to arrange appointment.   Return to ER if worse, new symptoms, fevers, persistent vomiting, new or worsening or severe abdominal pain or pelvic pain, chest pain, trouble breahting, weak/fainting, or other concern.  You were given pain meds in the ER - no driving for the next 6 hours.

## 2023-10-26 ENCOUNTER — Ambulatory Visit: Payer: Self-pay | Admitting: Surgery

## 2023-10-26 NOTE — H&P (Signed)
 Subjective    Chief Complaint: New Consultation (Eval of GB)       History of Present Illness: Rachael Moran is a 53 y.o. female who is seen today as an office consultation at the request of Jorge Ny, NP for evaluation of New Consultation (Eval of GB) .     This is a 53 year old female who presents with several episodes of upper abdominal pain over the last 6 to 8 months.  4 days ago, she had a severe attack that resulted in a visit to the emergency department.  She had pain in her upper abdomen associated with nausea, vomiting, diarrhea, and abdominal bloating.  Ultrasound showed cholelithiasis but no sign of acute cholecystitis.  CT scan revealed fatty liver and cholelithiasis.  Her symptoms improved so she was discharged and presents now for discussion about cholecystectomy.  She was also incidentally found to have left hydrosalpinx and is being evaluated by GYN.  No previous abdominal surgery.     Review of Systems: A complete review of systems was obtained from the patient.  I have reviewed this information and discussed as appropriate with the patient.  See HPI as well for other ROS.   Review of Systems  Constitutional: Negative.   HENT: Negative.    Eyes: Negative.   Respiratory: Negative.    Cardiovascular: Negative.   Gastrointestinal:  Positive for abdominal pain, diarrhea, nausea and vomiting.  Genitourinary: Negative.   Musculoskeletal: Negative.   Skin: Negative.   Neurological: Negative.   Endo/Heme/Allergies: Negative.   Psychiatric/Behavioral: Negative.          Medical History: Past Medical History      Past Medical History:  Diagnosis Date   Hypertension     Sleep apnea           Problem List  There is no problem list on file for this patient.      Past Surgical History       Past Surgical History:  Procedure Laterality Date   Back Surgery            Allergies      Allergies  Allergen Reactions   Lisinopril Cough        Medications  Ordered Prior to Encounter        Current Outpatient Medications on File Prior to Visit  Medication Sig Dispense Refill   amLODIPine (NORVASC) 5 MG tablet TAKE 1 TABLET BY MOUTH EVERY DAY FOR 90 DAYS for 90       cholecalciferol (VITAMIN D3) 1,250 mcg (50,000 unit) capsule 1 capsule       hydroCHLOROthiazide (HYDRODIURIL) 25 MG tablet 1 tablet in the morning Orally Once a day for 90 days       TURMERIC ORAL Take by mouth        No current facility-administered medications on file prior to visit.        Family History       Family History  Problem Relation Age of Onset   Hyperlipidemia (Elevated cholesterol) Mother     High blood pressure (Hypertension) Mother     Breast cancer Mother     Diabetes Father     Obesity Father     High blood pressure (Hypertension) Father     Hyperlipidemia (Elevated cholesterol) Father     Diabetes Sister          Tobacco Use History  Social History       Tobacco Use  Smoking Status Never  Smokeless Tobacco  Never        Social History  Social History        Socioeconomic History   Marital status: Single  Tobacco Use   Smoking status: Never   Smokeless tobacco: Never  Vaping Use   Vaping status: Never Used  Substance and Sexual Activity   Alcohol use: Yes   Drug use: Never    Social Drivers of Health        Housing Stability: Unknown (10/26/2023)    Housing Stability Vital Sign     Homeless in the Last Year: No        Objective:         Vitals:    10/26/23 0959  BP: 136/84  Pulse: 71  Temp: 36.7 C (98.1 F)  SpO2: 96%  Weight: 81.2 kg (179 lb)  Height: 177.8 cm (5\' 10" )  PainSc: 0-No pain    Body mass index is 25.68 kg/m.   Physical Exam    Constitutional:  WDWN in NAD, conversant, no obvious deformities; lying in bed comfortably Eyes:  Pupils equal, round; sclera anicteric; moist conjunctiva; no lid lag HENT:  Oral mucosa moist; good dentition  Neck:  No masses palpated, trachea midline; no  thyromegaly Lungs:  CTA bilaterally; normal respiratory effort CV:  Regular rate and rhythm; no murmurs; extremities well-perfused with no edema Abd:  +bowel sounds, soft, non-tender, no palpable organomegaly; no palpable hernias Musc:  Normal gait; no apparent clubbing or cyanosis in extremities Lymphatic:  No palpable cervical or axillary lymphadenopathy Skin:  Warm, dry; no sign of jaundice Psychiatric - alert and oriented x 4; calm mood and affect     Labs, Imaging and Diagnostic Testing: CLINICAL DATA:  Acute abdominal pain beginning this morning. Cough ground emesis.   EXAM: CT ABDOMEN AND PELVIS WITH CONTRAST   TECHNIQUE: Multidetector CT imaging of the abdomen and pelvis was performed using the standard protocol following bolus administration of intravenous contrast.   RADIATION DOSE REDUCTION: This exam was performed according to the departmental dose-optimization program which includes automated exposure control, adjustment of the mA and/or kV according to patient size and/or use of iterative reconstruction technique.   CONTRAST:  OMNIPAQUE IOHEXOL 300 MG/ML  SOLN   COMPARISON:  None Available.   FINDINGS: Lower Chest: No acute findings.   Hepatobiliary: No suspicious hepatic masses identified. Mild diffuse hepatic steatosis noted. Numerous small gallstones are seen, without signs of cholecystitis or biliary ductal dilatation.   Pancreas:  No mass or inflammatory changes.   Spleen: Within normal limits in size and appearance.   Adrenals/Urinary Tract: No suspicious masses identified. No evidence of ureteral calculi or hydronephrosis.   Stomach/Bowel: No evidence of obstruction, inflammatory process or abnormal fluid collections. Normal appendix visualized. Diverticulosis is seen mainly involving the sigmoid colon, however there is no evidence of diverticulitis.   Vascular/Lymphatic: No pathologically enlarged lymph nodes. No acute vascular  findings.   Reproductive: Normal appearance of uterus. A small serpiginous cystic area is seen in the left adnexa, highly suspicious for a mild hydrosalpinx. No No evidence of inflammatory changes or free fluid.   Other:  None.   Musculoskeletal:  No suspicious bone lesions identified.   IMPRESSION: No acute findings.   Colonic diverticulosis, without radiographic evidence of diverticulitis.   Cholelithiasis. No radiographic evidence of cholecystitis.   Mild hepatic steatosis.   Suspect mild left hydrosalpinx. Pelvic ultrasound could be performed for further evaluation.     Electronically Signed   By: Danae Orleans  M.D.   On: 10/22/2023 15:57   CLINICAL DATA:  Abdominal pain.   EXAM: ULTRASOUND ABDOMEN LIMITED RIGHT UPPER QUADRANT   COMPARISON:  CT abdomen pelvis dated 10/22/2023.   FINDINGS: Gallbladder:   Multiple gallstones. No gallbladder wall thickening or pericholecystic fluid. Negative sonographic Murphy's sign.   Common bile duct:   Diameter: 6 mm   Liver:   There is diffuse increased liver echogenicity most commonly seen in the setting of fatty infiltration. Superimposed inflammation or fibrosis is not excluded. Clinical correlation is recommended. Portal vein is patent on color Doppler imaging with normal direction of blood flow towards the liver.   Other: None.   IMPRESSION: 1. Cholelithiasis without sonographic evidence of acute cholecystitis. 2. Fatty liver.     Electronically Signed   By: Elgie Collard M.D.   On: 10/22/2023 18:44     Assessment and Plan:  Diagnoses and all orders for this visit:   Calculus of gallbladder with chronic cholecystitis without obstruction   Recommend laparoscopic cholecystectomy with intraoperative cholangiogram.The surgical procedure has been discussed with the patient.  Potential risks, benefits, alternative treatments, and expected outcomes have been explained.  All of the patient's questions at  this time have been answered.  The likelihood of reaching the patient's treatment goal is good.  The patient understands the proposed surgical procedure and wishes to proceed.     If the patient requires a gynecologic procedure for her left hydrosalpinx, we can consider coordinating surgery at the same time as gynecology.   Labresha Mellor Delbert Harness, MD  10/26/2023 10:48 AM

## 2023-10-29 ENCOUNTER — Other Ambulatory Visit: Payer: Self-pay | Admitting: Family Medicine

## 2023-10-29 DIAGNOSIS — R9389 Abnormal findings on diagnostic imaging of other specified body structures: Secondary | ICD-10-CM

## 2023-11-14 ENCOUNTER — Ambulatory Visit
Admission: RE | Admit: 2023-11-14 | Discharge: 2023-11-14 | Disposition: A | Discharge status: Home / Self Care | Source: Ambulatory Visit | Attending: Family Medicine | Admitting: Family Medicine

## 2023-11-14 DIAGNOSIS — R9389 Abnormal findings on diagnostic imaging of other specified body structures: Secondary | ICD-10-CM

## 2023-11-14 MED ORDER — GADOPICLENOL 0.5 MMOL/ML IV SOLN
10.0000 mL | Freq: Once | INTRAVENOUS | Status: AC | PRN
  Administered 2023-11-14: 10 mL via INTRAVENOUS

## 2024-03-19 ENCOUNTER — Ambulatory Visit (HOSPITAL_COMMUNITY): Admit: 2024-03-19 | Admitting: Obstetrics and Gynecology

## 2024-03-19 SURGERY — SALPINGECTOMY, BILATERAL, LAPAROSCOPIC
Anesthesia: Choice

## 2024-04-16 ENCOUNTER — Ambulatory Visit: Payer: Self-pay | Admitting: Surgery

## 2024-04-16 ENCOUNTER — Encounter (HOSPITAL_COMMUNITY): Payer: Self-pay | Admitting: Obstetrics and Gynecology

## 2024-04-18 ENCOUNTER — Encounter (HOSPITAL_COMMUNITY): Payer: Self-pay | Admitting: Obstetrics and Gynecology

## 2024-04-18 NOTE — Progress Notes (Signed)
 Spoke w/ via phone for pre-op interview--- Rachael Moran needs dos----  BMP per anesthesia.        Moran results------Current EKG in Epic dated 10/22/23. COVID test -----patient states asymptomatic no test needed Arrive at -------0820 NPO after MN NO Solid Food.   Pre-Surgery Ensure or G2:  Med rec completed Medications to take morning of surgery ----- Norvasc Diabetic medication -----  GLP1 agonist last dose: GLP1 instructions:  Patient instructed no nail polish to be worn day of surgery Patient instructed to bring photo id and insurance card day of surgery Patient aware to have Driver (ride ) / caregiver    for 24 hours after surgery - sister [name] Patient Special Instructions ----- Pre-Op special Instructions -----  Patient verbalized understanding of instructions that were given at this phone interview. Patient denies chest pain, sob, fever, cough at the interview.

## 2024-04-21 ENCOUNTER — Other Ambulatory Visit: Payer: Self-pay | Admitting: Obstetrics and Gynecology

## 2024-04-21 DIAGNOSIS — N7011 Chronic salpingitis: Secondary | ICD-10-CM

## 2024-04-21 NOTE — H&P (Signed)
 Subjective    Chief Complaint: New Consultation (Eval of GB)       History of Present Illness: Rachael Moran is a 53 y.o. female who is seen today as an office consultation at the request of Anthony Hint, NP for evaluation of New Consultation (Eval of GB) .     This is a 53 year old female who presents with several episodes of upper abdominal pain over the last 6 to 8 months.  4 days ago, she had a severe attack that resulted in a visit to the emergency department.  She had pain in her upper abdomen associated with nausea, vomiting, diarrhea, and abdominal bloating.  Ultrasound showed cholelithiasis but no sign of acute cholecystitis.  CT scan revealed fatty liver and cholelithiasis.  Her symptoms improved so she was discharged and presents now for discussion about cholecystectomy.  She was also incidentally found to have left hydrosalpinx and is being evaluated by GYN.  No previous abdominal surgery.     Review of Systems: A complete review of systems was obtained from the patient.  I have reviewed this information and discussed as appropriate with the patient.  See HPI as well for other ROS.   Review of Systems  Constitutional: Negative.   HENT: Negative.    Eyes: Negative.   Respiratory: Negative.    Cardiovascular: Negative.   Gastrointestinal:  Positive for abdominal pain, diarrhea, nausea and vomiting.  Genitourinary: Negative.   Musculoskeletal: Negative.   Skin: Negative.   Neurological: Negative.   Endo/Heme/Allergies: Negative.   Psychiatric/Behavioral: Negative.          Medical History: Past Medical History         Past Medical History:  Diagnosis Date   Hypertension     Sleep apnea            Problem List  There is no problem list on file for this patient.      Past Surgical History           Past Surgical History:  Procedure Laterality Date   Back Surgery            Allergies         Allergies  Allergen Reactions   Lisinopril Cough         Medications Ordered Prior to Encounter             Current Outpatient Medications on File Prior to Visit  Medication Sig Dispense Refill   amLODIPine (NORVASC) 5 MG tablet TAKE 1 TABLET BY MOUTH EVERY DAY FOR 90 DAYS for 90       cholecalciferol (VITAMIN D3) 1,250 mcg (50,000 unit) capsule 1 capsule       hydroCHLOROthiazide (HYDRODIURIL) 25 MG tablet 1 tablet in the morning Orally Once a day for 90 days       TURMERIC ORAL Take by mouth        No current facility-administered medications on file prior to visit.        Family History           Family History  Problem Relation Age of Onset   Hyperlipidemia (Elevated cholesterol) Mother     High blood pressure (Hypertension) Mother     Breast cancer Mother     Diabetes Father     Obesity Father     High blood pressure (Hypertension) Father     Hyperlipidemia (Elevated cholesterol) Father     Diabetes Sister          Tobacco  Use History  Social History         Tobacco Use  Smoking Status Never  Smokeless Tobacco Never        Social History  Social History           Socioeconomic History   Marital status: Single  Tobacco Use   Smoking status: Never   Smokeless tobacco: Never  Vaping Use   Vaping status: Never Used  Substance and Sexual Activity   Alcohol use: Yes   Drug use: Never    Social Drivers of Health           Housing Stability: Unknown (10/26/2023)    Housing Stability Vital Sign     Homeless in the Last Year: No        Objective:           Vitals:    10/26/23 0959  BP: 136/84  Pulse: 71  Temp: 36.7 C (98.1 F)  SpO2: 96%  Weight: 81.2 kg (179 lb)  Height: 177.8 cm (5' 10)  PainSc: 0-No pain    Body mass index is 25.68 kg/m.   Physical Exam    Constitutional:  WDWN in NAD, conversant, no obvious deformities; lying in bed comfortably Eyes:  Pupils equal, round; sclera anicteric; moist conjunctiva; no lid lag HENT:  Oral mucosa moist; good dentition  Neck:  No masses palpated,  trachea midline; no thyromegaly Lungs:  CTA bilaterally; normal respiratory effort CV:  Regular rate and rhythm; no murmurs; extremities well-perfused with no edema Abd:  +bowel sounds, soft, non-tender, no palpable organomegaly; no palpable hernias Musc:  Normal gait; no apparent clubbing or cyanosis in extremities Lymphatic:  No palpable cervical or axillary lymphadenopathy Skin:  Warm, dry; no sign of jaundice Psychiatric - alert and oriented x 4; calm mood and affect     Labs, Imaging and Diagnostic Testing: CLINICAL DATA:  Acute abdominal pain beginning this morning. Cough ground emesis.   EXAM: CT ABDOMEN AND PELVIS WITH CONTRAST   TECHNIQUE: Multidetector CT imaging of the abdomen and pelvis was performed using the standard protocol following bolus administration of intravenous contrast.   RADIATION DOSE REDUCTION: This exam was performed according to the departmental dose-optimization program which includes automated exposure control, adjustment of the mA and/or kV according to patient size and/or use of iterative reconstruction technique.   CONTRAST:  OMNIPAQUE  IOHEXOL  300 MG/ML  SOLN   COMPARISON:  None Available.   FINDINGS: Lower Chest: No acute findings.   Hepatobiliary: No suspicious hepatic masses identified. Mild diffuse hepatic steatosis noted. Numerous small gallstones are seen, without signs of cholecystitis or biliary ductal dilatation.   Pancreas:  No mass or inflammatory changes.   Spleen: Within normal limits in size and appearance.   Adrenals/Urinary Tract: No suspicious masses identified. No evidence of ureteral calculi or hydronephrosis.   Stomach/Bowel: No evidence of obstruction, inflammatory process or abnormal fluid collections. Normal appendix visualized. Diverticulosis is seen mainly involving the sigmoid colon, however there is no evidence of diverticulitis.   Vascular/Lymphatic: No pathologically enlarged lymph nodes. No  acute vascular findings.   Reproductive: Normal appearance of uterus. A small serpiginous cystic area is seen in the left adnexa, highly suspicious for a mild hydrosalpinx. No No evidence of inflammatory changes or free fluid.   Other:  None.   Musculoskeletal:  No suspicious bone lesions identified.   IMPRESSION: No acute findings.   Colonic diverticulosis, without radiographic evidence of diverticulitis.   Cholelithiasis. No radiographic evidence of cholecystitis.  Mild hepatic steatosis.   Suspect mild left hydrosalpinx. Pelvic ultrasound could be performed for further evaluation.     Electronically Signed   By: Norleen DELENA Kil M.D.   On: 10/22/2023 15:57   CLINICAL DATA:  Abdominal pain.   EXAM: ULTRASOUND ABDOMEN LIMITED RIGHT UPPER QUADRANT   COMPARISON:  CT abdomen pelvis dated 10/22/2023.   FINDINGS: Gallbladder:   Multiple gallstones. No gallbladder wall thickening or pericholecystic fluid. Negative sonographic Murphy's sign.   Common bile duct:   Diameter: 6 mm   Liver:   There is diffuse increased liver echogenicity most commonly seen in the setting of fatty infiltration. Superimposed inflammation or fibrosis is not excluded. Clinical correlation is recommended. Portal vein is patent on color Doppler imaging with normal direction of blood flow towards the liver.   Other: None.   IMPRESSION: 1. Cholelithiasis without sonographic evidence of acute cholecystitis. 2. Fatty liver.     Electronically Signed   By: Vanetta Chou M.D.   On: 10/22/2023 18:44     Assessment and Plan:  Diagnoses and all orders for this visit:   Calculus of gallbladder with chronic cholecystitis without obstruction   Recommend laparoscopic cholecystectomy with intraoperative cholangiogram.The surgical procedure has been discussed with the patient.  Potential risks, benefits, alternative treatments, and expected outcomes have been explained.  All of the patient's  questions at this time have been answered.  The likelihood of reaching the patient's treatment goal is good.  The patient understands the proposed surgical procedure and wishes to proceed.  Donnice POUR. Belinda, MD, [facility]  General Surgery   04/21/2024 11:59 AM

## 2024-04-21 NOTE — H&P (Signed)
 eason for Appointment   1.  Preop vt History of Present Illness    General:  53 y/o presents for pre-op visit. Pt is schedule for laparoscopic bilateral salpingectomy on 04/22/2024 for the management of  suspected hydrosalpinx  on fallopian tube.  IN REVEW:  She reported an old bloody discharge that she noticed a couple time last year. She had a pelvic ultrasound and em biopsy work up was benign.  she has been diagnosed with gall stones. she is planning on a cholecystectomy with Dr. Alpha and would like to have the hydrosalpinx that was seen on MRI removed at the time of cholecystectomy.  --- Pt was seen in ER for nausea/ vomiting and severe abdominal pain. She was also diagnosed with gall stones and is seeing Dr. Belinda of [facility]. Pt states that Dr. Belinda will be scheduling a gallbladder surgery and has given option to attempt to coordinate a GYN surgery if needed for the incidental finding of the fallopian tube mass.  History of menopause several years ago. Has reports some dark brown vaginal discharge x past 6- 8 weeks.  --- CT scan ABD/pelvis from 10/22/23 Impression; 1. Cholelithiasis without sonographic evidence of acute cholecystitis. 2. Fatty liver --- GYN ULTRASOUND from 10/22/23 Uterus 6.9x3.9x3.6cm/ uterine volume 50ml. Within the anterior uterus there is a 2.3x1.6x3.1cm mass likely representing a fibroid. Endometrium; thickness 9mm. No focal abnormality visualized. Right ovary not visualized well. Left ovary measurements 3.4x1.6x2.0cm/ volume 5.55ml. Complex hyperechoic area left of the uterus adjacent to the left anexa measuring 3.9x1.8x3.2cm. IMPRESSION 1. Indeterminate complex hyperechoic area confluent with the borders of the left uterus and adnexa measuring 3.9x1.8x3.2cm. This could represent a dilated fallopian tube containing blood products or purulent material. Uterine or adnexal mass is not excluded. Given the limitations of this study. Recommend further evaluation  with pelvic MRI with and without IV contrast. 2. right ovary not well visualized. --- PELVIC MRI from 11/18/23 Findings Urinary tract; no abnormality visualized Bowel Occasional sigmoid diverticula Vascular/Lymphatic; no pathologically enlarged lymph nodes. No significant vascular abnormality seen. Reproductive; normal uterus. Normal postmenopausal appearance of right ovary. Serpiginous fluid signal lesion adjacent to the left ovary measuring 4.9 cm in length. Small intrinsically T1 hyperintense, nonenhancing hemorrhagic or proteinaceous cysts of the left ovary. Other; None Musculoskeletal; No suspicious bone lesions identified; IMPRESSION; 1. Serpiginous fluid signal lesion adjacent to the left ovary measuring 4.9cm in length, consistent with a small hydrosalpinx. No acute inflammatory findings in the pelvis. 2. Small intrinsically T1 hyperintense, nonenhancing hemorrhagic nonproteinaceous cysts of the left ovary which may be hemorrhagic or proteinaceous follicular remnants or alternately small endometriomas. 3.. Normal postmenopausal appearance of the uterus and right ovary. 4. Occasional sigmoid diverticula.  Current Medications Taking amLODIPine Besylate 5 MG Tablet TAKE 1 TABLET BY MOUTH EVERY DAY FOR 90 DAYS hydroCHLOROthiazide 25 MG Tablet 1 tablet in the morning Orally Once a day Vitamin D3 1.25 MG (50000 UT) Capsule 1 capsule Orally once a week Turmeric Curcumin 500 MG Capsule as directed Orally Medication List reviewed and reconciled with the patient Past Medical History Back pain- degenerative discs. Obesity. Environmental allergies. HTN. Prediabetes. Right Knee arthritis. Surgical History microdiscectomy L4-5 2008 Colonoscopy repeat in 10 years. 10/20/2020 Family History Father: deceased 75 yrs, high cholesterol, obesity -passed 12/06/21 complications from diabetes, diagnosed with Hypertension, Diabetes Mother: deceased 67 yrs, RA and OA, high cholesterol had breast  cancer, now recovered, metastatic breast cancer, diagnosed with Breast cancer, Hypertension Paternal Grand Father: deceased Paternal Grand Mother: deceased Maternal [employer]  Father: deceased Maternal Grand Mother: deceased Sister 1: alive 22 yrs, Uterine cancer/hysterectomy, diabetes, high cholesterol Sister has uterine fibroids. Positive hx for breast cancer. No known family hx of colon cancer, polyps, or liver disease. Social History    General:  Tobacco use cigarettes: Never smoked, Tobacco history last updated 04/10/2024, Vaping No. EXPOSURE TO PASSIVE SMOKE: in the past. Alcohol: yes, wine 4 servings yearly. Caffeine: yes, coffee 3 servings daily. Recreational drug use: never. DIET: no particular dietary program. Exercise: NONE. Marital Status: Single. Children: none. EDUCATION: [employer], [employer]. OCCUPATION: employed, Production designer, theatre/television/film at [employer]. Seat belt use: yes, always. Firearms at home: no. Gyn History Sexual activity not currently sexually active.  Periods : postmenopausal.  LMP about 3 years ago from today 12/04/20.  Birth control none.  Last pap smear date 03/27/2023 negative.  Last mammogram date 10/04/2023-neg.  Denies H/O Abnormal pap smear.  Denies H/O STD.  OB History Never been pregnant  per patient.  Allergies LISINOPRIL: ace cough - Side Effects Hospitalization/Major Diagnostic Procedure surgery 2008 Gallbladder attack- discovered Hydrosalpinx cyst on left ovary 10/2023 Review of Systems    CONSTITUTIONAL:  Chills No. Fatigue No. Fever No. Night sweats No. Recent travel outside [place]  No. Sweats No. Weight change No.     OPHTHALMOLOGY:  Blurring of vision no. Change in vision no. Double vision no.     ENT:  Dizziness no. Nose bleeds no. Sore throat no. Teeth pain no.     ALLERGY:  Hives no.     CARDIOLOGY:  Chest pain no. High blood pressure no. Irregular heart beat no. Leg edema no. Palpitations no.     RESPIRATORY:  Shortness of breath no.  Cough no. Wheezing no.     UROLOGY:  Pain with urination no. Urinary urgency no. Urinary frequency no. Urinary incontinence no. Difficulty urinating No. Blood in urine No.     GASTROENTEROLOGY:  Abdominal pain , YES. Appetite change no. Bloating/belching no. Blood in stool or on toilet paper no. Change in bowel movements no. Constipation no. Diarrhea no. Difficulty swallowing no. Nausea no.     FEMALE REPRODUCTIVE:  Vulvar pain no. Vulvar rash no. Abnormal vaginal bleeding no. Breast pain no. Nipple discharge no. Pain with intercourse no. Pelvic pain no. Unusual vaginal discharge no. Vaginal itching no.     MUSCULOSKELETAL:  Muscle aches no.     NEUROLOGY:  Headache no. Tingling/numbness no. Weakness no.     PSYCHOLOGY:  Depression no. Anxiety no. Nervousness no. Sleep disturbances no. Suicidal ideation no .     ENDOCRINOLOGY:  Excessive thirst no. Excessive urination no. Hair loss no. Heat or cold intolerance no.     HEMATOLOGY/LYMPH:  Abnormal bleeding no. Easy bruising no. Swollen glands no.     DERMATOLOGY:  New/changing skin lesion no. Rash no. Sores no.  Vital Signs Wt: 270.2, Wt change: -5 lbs, Ht: 69, BMI: 39.9, Pulse sitting: 84, BP sitting: 110/74. Examination    General Examination: CONSTITUTIONAL: alert, oriented, NAD.  SKIN:  moist, warm.  EYES:  Conjunctiva clear.  LUNGS: good I:E efffort noted, clear to auscultation bilaterally.  HEART: regular rate and rhythm.  ABDOMEN: soft, non-tender/non-distended, bowel sounds present.  FEMALE GENITOURINARY: normal external genitalia, labia - unremarkable, vagina - pink moist mucosa, no lesions or abnormal discharge, cervix - no discharge or lesions or CMT, adnexa - no masses or tenderness, uterus - nontender and normal size on palpation.  EXTREMITIES: no edema present.  PSYCH:  affect normal, good eye contact.  Assessments 1. Hydrosalpinx - N70.11 (Primary)    Treatment 1. Hydrosalpinx        Notes: This is most likely a  hydrosalpinx... pt desires to proceed with laparoscopic bilateral salpingectomy. risk of surgery discussed with Ms. Heiner including but not limited to infection , bleeding, damage to bowel and surrounding organs with the need for further surgery . she voiced understanding and desires to proceed. she is advised to avoid eating or drinking after midnignt the nigh prior to surgery. avoid NSAID 10 days prior to surgery. she wil be unable to drive for 3-5 days after surgery or while requiring narcotics for pain. she should avoid lifting over 10 lbs for 2 weeks after surgery.

## 2024-04-22 ENCOUNTER — Other Ambulatory Visit: Payer: Self-pay

## 2024-04-22 ENCOUNTER — Ambulatory Visit (HOSPITAL_COMMUNITY)
Admission: RE | Admit: 2024-04-22 | Discharge: 2024-04-22 | Disposition: A | Discharge status: Home / Self Care | Attending: Obstetrics and Gynecology | Admitting: Obstetrics and Gynecology

## 2024-04-22 ENCOUNTER — Encounter (HOSPITAL_COMMUNITY): Payer: Self-pay | Admitting: Obstetrics and Gynecology

## 2024-04-22 ENCOUNTER — Encounter (HOSPITAL_COMMUNITY)
Admission: RE | Disposition: A | Discharge status: Home / Self Care | Payer: Self-pay | Source: Home / Self Care | Attending: Obstetrics and Gynecology

## 2024-04-22 ENCOUNTER — Ambulatory Visit (HOSPITAL_COMMUNITY): Admitting: Anesthesiology

## 2024-04-22 ENCOUNTER — Ambulatory Visit (HOSPITAL_BASED_OUTPATIENT_CLINIC_OR_DEPARTMENT_OTHER): Admitting: Anesthesiology

## 2024-04-22 ENCOUNTER — Other Ambulatory Visit (HOSPITAL_COMMUNITY): Payer: Self-pay

## 2024-04-22 DIAGNOSIS — I10 Essential (primary) hypertension: Secondary | ICD-10-CM | POA: Diagnosis not present

## 2024-04-22 DIAGNOSIS — N736 Female pelvic peritoneal adhesions (postinfective): Secondary | ICD-10-CM | POA: Diagnosis not present

## 2024-04-22 DIAGNOSIS — K8010 Calculus of gallbladder with chronic cholecystitis without obstruction: Secondary | ICD-10-CM | POA: Diagnosis present

## 2024-04-22 DIAGNOSIS — N7011 Chronic salpingitis: Secondary | ICD-10-CM | POA: Insufficient documentation

## 2024-04-22 DIAGNOSIS — N838 Other noninflammatory disorders of ovary, fallopian tube and broad ligament: Secondary | ICD-10-CM | POA: Insufficient documentation

## 2024-04-22 DIAGNOSIS — Z79899 Other long term (current) drug therapy: Secondary | ICD-10-CM | POA: Diagnosis not present

## 2024-04-22 HISTORY — DX: Essential (primary) hypertension: I10

## 2024-04-22 HISTORY — DX: Prediabetes: R73.03

## 2024-04-22 LAB — BASIC METABOLIC PANEL WITH GFR
Anion gap: 11 (ref 5–15)
BUN: 15 mg/dL (ref 6–20)
CO2: 24 mmol/L (ref 22–32)
Calcium: 9.5 mg/dL (ref 8.9–10.3)
Chloride: 103 mmol/L (ref 98–111)
Creatinine, Ser: 0.62 mg/dL (ref 0.44–1.00)
GFR, Estimated: >60 mL/min (ref >60)
Glucose, Bld: 104 mg/dL — ABNORMAL HIGH (ref 70–99)
Potassium: 3.9 mmol/L (ref 3.5–5.1)
Sodium: 138 mmol/L (ref 135–145)

## 2024-04-22 LAB — CBC
HCT: 42.6 % (ref 36.0–46.0)
Hemoglobin: 13.9 g/dL (ref 12.0–15.0)
MCH: 28.7 pg (ref 26.0–34.0)
MCHC: 32.6 g/dL (ref 30.0–36.0)
MCV: 87.8 fL (ref 80.0–100.0)
Platelets: 265 K/uL (ref 150–400)
RBC: 4.85 MIL/uL (ref 3.87–5.11)
RDW: 13.4 % (ref 11.5–15.5)
WBC: 6.8 K/uL (ref 4.0–10.5)
nRBC: 0 % (ref 0.0–0.2)

## 2024-04-22 SURGERY — SALPINGECTOMY, BILATERAL, LAPAROSCOPIC
Anesthesia: General | Site: Abdomen

## 2024-04-22 MED ORDER — BUPIVACAINE HCL (PF) 0.25 % IJ SOLN
INTRAMUSCULAR | Status: AC
  Filled 2024-04-22: qty 30

## 2024-04-22 MED ORDER — SODIUM CHLORIDE 0.9 % IR SOLN
Status: DC | PRN
  Administered 2024-04-22: 1000 mL

## 2024-04-22 MED ORDER — CHLORHEXIDINE GLUCONATE CLOTH 2 % EX PADS: 6.0000 | MEDICATED_PAD | Freq: Once | CUTANEOUS | Status: DC

## 2024-04-22 MED ORDER — OXYCODONE HCL 5 MG PO TABS: 5.0000 mg | ORAL_TABLET | Freq: Four times a day (QID) | ORAL | 0 refills | Status: DC | PRN

## 2024-04-22 MED ORDER — BUPIVACAINE HCL (PF) 0.25 % IJ SOLN
INTRAMUSCULAR | Status: DC | PRN
  Administered 2024-04-22: 30 mL

## 2024-04-22 MED ORDER — CHLORHEXIDINE GLUCONATE 0.12 % MT SOLN
OROMUCOSAL | Status: AC
  Filled 2024-04-22: qty 15

## 2024-04-22 MED ORDER — BUPIVACAINE-EPINEPHRINE 0.25% -1:200000 IJ SOLN
INTRAMUSCULAR | Status: DC | PRN
  Administered 2024-04-22: 5 mL

## 2024-04-22 MED ORDER — CEFAZOLIN SODIUM-DEXTROSE 2-4 GM/100ML-% IV SOLN
INTRAVENOUS | Status: AC
  Filled 2024-04-22: qty 100

## 2024-04-22 MED ORDER — LIDOCAINE 2% (20 MG/ML) 5 ML SYRINGE
INTRAMUSCULAR | Status: AC
  Filled 2024-04-22: qty 5

## 2024-04-22 MED ORDER — SCOPOLAMINE 1 MG/3DAYS TD PT72
MEDICATED_PATCH | TRANSDERMAL | Status: AC
  Filled 2024-04-22: qty 1

## 2024-04-22 MED ORDER — CEFAZOLIN SODIUM-DEXTROSE 1-4 GM/50ML-% IV SOLN
INTRAVENOUS | Status: AC
  Filled 2024-04-22: qty 50

## 2024-04-22 MED ORDER — MIDAZOLAM HCL 2 MG/2ML IJ SOLN
INTRAMUSCULAR | Status: AC
  Filled 2024-04-22: qty 2

## 2024-04-22 MED ORDER — EPHEDRINE SULFATE-NACL 50-0.9 MG/10ML-% IV SOSY
PREFILLED_SYRINGE | INTRAVENOUS | Status: DC | PRN
  Administered 2024-04-22: 5 mg via INTRAVENOUS

## 2024-04-22 MED ORDER — ALBUMIN HUMAN 5 % IV SOLN: INTRAVENOUS | Status: DC | PRN

## 2024-04-22 MED ORDER — FENTANYL CITRATE (PF) 100 MCG/2ML IJ SOLN: 25.0000 ug | INTRAMUSCULAR | Status: DC | PRN

## 2024-04-22 MED ORDER — SCOPOLAMINE 1 MG/3DAYS TD PT72
1.0000 | MEDICATED_PATCH | TRANSDERMAL | Status: DC
  Administered 2024-04-22: 1 mg via TRANSDERMAL

## 2024-04-22 MED ORDER — HEMOSTATIC AGENTS (NO CHARGE) OPTIME
TOPICAL | Status: DC | PRN
  Administered 2024-04-22: 1 via TOPICAL

## 2024-04-22 MED ORDER — 0.9 % SODIUM CHLORIDE (POUR BTL) OPTIME
TOPICAL | Status: DC | PRN
  Administered 2024-04-22: 1000 mL

## 2024-04-22 MED ORDER — MIDAZOLAM HCL 2 MG/2ML IJ SOLN
INTRAMUSCULAR | Status: DC | PRN
Start: 2024-04-22 — End: 2024-04-22
  Administered 2024-04-22: 2 mg via INTRAVENOUS

## 2024-04-22 MED ORDER — OXYCODONE HCL 5 MG PO TABS
5.0000 mg | ORAL_TABLET | Freq: Four times a day (QID) | ORAL | 0 refills | Status: AC | PRN
  Filled 2024-04-22: qty 20, 5d supply, fill #0

## 2024-04-22 MED ORDER — ONDANSETRON HCL 4 MG/2ML IJ SOLN
INTRAMUSCULAR | Status: AC
  Filled 2024-04-22: qty 2

## 2024-04-22 MED ORDER — ACETAMINOPHEN 500 MG PO TABS: 1000.0000 mg | ORAL_TABLET | ORAL | Status: DC

## 2024-04-22 MED ORDER — FENTANYL CITRATE (PF) 250 MCG/5ML IJ SOLN
INTRAMUSCULAR | Status: AC
  Filled 2024-04-22: qty 5

## 2024-04-22 MED ORDER — DEXAMETHASONE SODIUM PHOSPHATE 10 MG/ML IJ SOLN
INTRAMUSCULAR | Status: AC
Start: 2024-04-22 — End: 2024-04-22
  Filled 2024-04-22: qty 1

## 2024-04-22 MED ORDER — KETOROLAC TROMETHAMINE 30 MG/ML IJ SOLN
INTRAMUSCULAR | Status: AC
  Filled 2024-04-22: qty 1

## 2024-04-22 MED ORDER — AMISULPRIDE (ANTIEMETIC) 5 MG/2ML IV SOLN
INTRAVENOUS | Status: AC
  Filled 2024-04-22: qty 4

## 2024-04-22 MED ORDER — PHENYLEPHRINE 80 MCG/ML (10ML) SYRINGE FOR IV PUSH (FOR BLOOD PRESSURE SUPPORT)
PREFILLED_SYRINGE | INTRAVENOUS | Status: AC
  Filled 2024-04-22: qty 10

## 2024-04-22 MED ORDER — PROPOFOL 10 MG/ML IV BOLUS
INTRAVENOUS | Status: AC
  Filled 2024-04-22: qty 20

## 2024-04-22 MED ORDER — ROCURONIUM BROMIDE 10 MG/ML (PF) SYRINGE
PREFILLED_SYRINGE | INTRAVENOUS | Status: AC
Start: 2024-04-22 — End: 2024-04-22
  Filled 2024-04-22: qty 10

## 2024-04-22 MED ORDER — FENTANYL CITRATE (PF) 250 MCG/5ML IJ SOLN
INTRAMUSCULAR | Status: DC | PRN
  Administered 2024-04-22: 150 ug via INTRAVENOUS
  Administered 2024-04-22 (×2): 50 ug via INTRAVENOUS

## 2024-04-22 MED ORDER — POVIDONE-IODINE 10 % EX SWAB: 2.0000 | Freq: Once | CUTANEOUS | Status: DC

## 2024-04-22 MED ORDER — PHENYLEPHRINE 80 MCG/ML (10ML) SYRINGE FOR IV PUSH (FOR BLOOD PRESSURE SUPPORT)
PREFILLED_SYRINGE | INTRAVENOUS | Status: DC | PRN
Start: 2024-04-22 — End: 2024-04-22
  Administered 2024-04-22 (×3): 160 ug via INTRAVENOUS

## 2024-04-22 MED ORDER — SODIUM CHLORIDE 0.9 % IV SOLN: 2.0000 g | INTRAVENOUS | Status: DC

## 2024-04-22 MED ORDER — ACETAMINOPHEN 500 MG PO TABS
1000.0000 mg | ORAL_TABLET | ORAL | Status: AC
  Administered 2024-04-22: 1000 mg via ORAL

## 2024-04-22 MED ORDER — AMISULPRIDE (ANTIEMETIC) 5 MG/2ML IV SOLN
10.0000 mg | Freq: Once | INTRAVENOUS | Status: AC | PRN
  Administered 2024-04-22: 10 mg via INTRAVENOUS

## 2024-04-22 MED ORDER — ACETAMINOPHEN 500 MG PO TABS
ORAL_TABLET | ORAL | Status: AC
  Filled 2024-04-22: qty 2

## 2024-04-22 MED ORDER — GABAPENTIN 300 MG PO CAPS
ORAL_CAPSULE | ORAL | Status: AC
  Filled 2024-04-22: qty 1

## 2024-04-22 MED ORDER — KETOROLAC TROMETHAMINE 30 MG/ML IJ SOLN
30.0000 mg | Freq: Once | INTRAMUSCULAR | Status: AC | PRN
  Administered 2024-04-22: 30 mg via INTRAVENOUS

## 2024-04-22 MED ORDER — OXYCODONE HCL 5 MG/5ML PO SOLN: 5.0000 mg | Freq: Once | ORAL | Status: DC | PRN

## 2024-04-22 MED ORDER — LACTATED RINGERS IV SOLN: INTRAVENOUS | Status: DC

## 2024-04-22 MED ORDER — OXYCODONE HCL 5 MG PO TABS: 5.0000 mg | ORAL_TABLET | Freq: Once | ORAL | Status: DC | PRN

## 2024-04-22 MED ORDER — LIDOCAINE 2% (20 MG/ML) 5 ML SYRINGE
INTRAMUSCULAR | Status: DC | PRN
  Administered 2024-04-22: 60 mg via INTRAVENOUS

## 2024-04-22 MED ORDER — CHLORHEXIDINE GLUCONATE 0.12 % MT SOLN
15.0000 mL | Freq: Once | OROMUCOSAL | Status: AC
  Administered 2024-04-22: 15 mL via OROMUCOSAL

## 2024-04-22 MED ORDER — GABAPENTIN 300 MG PO CAPS
300.0000 mg | ORAL_CAPSULE | ORAL | Status: AC
  Administered 2024-04-22: 300 mg via ORAL

## 2024-04-22 MED ORDER — BUPIVACAINE-EPINEPHRINE (PF) 0.25% -1:200000 IJ SOLN
INTRAMUSCULAR | Status: AC
  Filled 2024-04-22: qty 30

## 2024-04-22 MED ORDER — DEXAMETHASONE SODIUM PHOSPHATE 10 MG/ML IJ SOLN
INTRAMUSCULAR | Status: DC | PRN
Start: 2024-04-22 — End: 2024-04-22
  Administered 2024-04-22: 10 mg via INTRAVENOUS

## 2024-04-22 MED ORDER — CEFAZOLIN SODIUM-DEXTROSE 3-4 GM/150ML-% IV SOLN
3.0000 g | INTRAVENOUS | Status: AC
Start: 2024-04-22 — End: 2024-04-22
  Administered 2024-04-22: 3 g via INTRAVENOUS

## 2024-04-22 MED ORDER — ORAL CARE MOUTH RINSE: 15.0000 mL | Freq: Once | OROMUCOSAL | Status: AC

## 2024-04-22 MED ORDER — ROCURONIUM BROMIDE 10 MG/ML (PF) SYRINGE
PREFILLED_SYRINGE | INTRAVENOUS | Status: DC | PRN
  Administered 2024-04-22: 60 mg via INTRAVENOUS
  Administered 2024-04-22: 20 mg via INTRAVENOUS

## 2024-04-22 MED ORDER — PROPOFOL 10 MG/ML IV BOLUS
INTRAVENOUS | Status: DC | PRN
  Administered 2024-04-22: 200 mg via INTRAVENOUS

## 2024-04-22 MED ORDER — ONDANSETRON HCL 4 MG/2ML IJ SOLN
INTRAMUSCULAR | Status: DC | PRN
  Administered 2024-04-22: 4 mg via INTRAVENOUS

## 2024-04-22 MED ORDER — SUGAMMADEX SODIUM 200 MG/2ML IV SOLN
INTRAVENOUS | Status: DC | PRN
  Administered 2024-04-22: 200 mg via INTRAVENOUS

## 2024-04-22 SURGICAL SUPPLY — 60 items
BAG COUNTER SPONGE SURGICOUNT (BAG) ×2 IMPLANT
BENZOIN TINCTURE PRP APPL 2/3 (GAUZE/BANDAGES/DRESSINGS) ×2 IMPLANT
BLADE CLIPPER SURG (BLADE) IMPLANT
CANISTER SUCTION 3000ML PPV (SUCTIONS) ×2 IMPLANT
CHLORAPREP W/TINT 26 (MISCELLANEOUS) ×2 IMPLANT
CLIP APPLIE ROT 10 11.4 M/L (STAPLE) ×2 IMPLANT
COVER MAYO STAND STRL (DRAPES) ×2 IMPLANT
COVER SURGICAL LIGHT HANDLE (MISCELLANEOUS) ×2 IMPLANT
DRAPE C-ARM 42X120 X-RAY (DRAPES) ×2 IMPLANT
DRAPE SURG IRRIG POUCH 19X23 (DRAPES) ×2 IMPLANT
DRSG OPSITE POSTOP 3X4 (GAUZE/BANDAGES/DRESSINGS) IMPLANT
DRSG TEGADERM 2-3/8X2-3/4 SM (GAUZE/BANDAGES/DRESSINGS) ×6 IMPLANT
DRSG TEGADERM 4X4.75 (GAUZE/BANDAGES/DRESSINGS) ×2 IMPLANT
ELECTRODE REM PT RTRN 9FT ADLT (ELECTROSURGICAL) ×2 IMPLANT
GAUZE SPONGE 2X2 8PLY STRL LF (GAUZE/BANDAGES/DRESSINGS) ×2 IMPLANT
GLOVE BIO SURGEON STRL SZ7 (GLOVE) ×2 IMPLANT
GLOVE BIOGEL M 6.5 STRL (GLOVE) ×2 IMPLANT
GLOVE BIOGEL PI IND STRL 6.5 (GLOVE) ×4 IMPLANT
GLOVE BIOGEL PI IND STRL 7.5 (GLOVE) ×2 IMPLANT
GOWN STRL REUS W/ TWL LRG LVL3 (GOWN DISPOSABLE) ×8 IMPLANT
HEMOSTAT SNOW SURGICEL 2X4 (HEMOSTASIS) IMPLANT
IRRIGATION SUCT STRKRFLW 2 WTP (MISCELLANEOUS) ×2 IMPLANT
KIT BASIN OR (CUSTOM PROCEDURE TRAY) ×2 IMPLANT
KIT IMAGING PINPOINTPAQ (MISCELLANEOUS) IMPLANT
KIT PINK PAD W/HEAD ARM REST (MISCELLANEOUS) ×2 IMPLANT
KIT TURNOVER KIT B (KITS) ×4 IMPLANT
LIGASURE VESSEL 5MM BLUNT TIP (ELECTROSURGICAL) IMPLANT
NS IRRIG 1000ML POUR BTL (IV SOLUTION) ×4 IMPLANT
PACK LAPAROSCOPY BASIN (CUSTOM PROCEDURE TRAY) ×2 IMPLANT
PAD ARMBOARD POSITIONER FOAM (MISCELLANEOUS) ×2 IMPLANT
POUCH LAPAROSCOPIC INSTRUMENT (MISCELLANEOUS) IMPLANT
POUCH RETRIEVAL ECOSAC 10 (ENDOMECHANICALS) IMPLANT
POWDER SURGICEL 3.0 GRAM (HEMOSTASIS) IMPLANT
SCISSORS LAP 5X35 DISP (ENDOMECHANICALS) ×2 IMPLANT
SET CHOLANGIOGRAPH 5 50 .035 (SET/KITS/TRAYS/PACK) ×2 IMPLANT
SET TUBE SMOKE EVAC HIGH FLOW (TUBING) ×4 IMPLANT
SHEARS HARMONIC 36 ACE (MISCELLANEOUS) IMPLANT
SLEEVE ADV FIXATION 5X100MM (TROCAR) ×2 IMPLANT
SLEEVE Z-THREAD 5X100MM (TROCAR) ×2 IMPLANT
SOLUTION ELECTROSURG ANTI STCK (MISCELLANEOUS) IMPLANT
SPECIMEN JAR SMALL (MISCELLANEOUS) ×2 IMPLANT
STRIP CLOSURE SKIN 1/2X4 (GAUZE/BANDAGES/DRESSINGS) ×2 IMPLANT
SUT MNCRL AB 4-0 PS2 18 (SUTURE) ×2 IMPLANT
SUT VIC AB 4-0 PS2 18 (SUTURE) ×2 IMPLANT
SUT VICRYL 0 UR6 27IN ABS (SUTURE) IMPLANT
SYR BULB IRRIG 60ML STRL (SYRINGE) IMPLANT
SYSTEM BAG RETRIEVAL 10MM (BASKET) IMPLANT
TAPE STRIPS DRAPE STRL (GAUZE/BANDAGES/DRESSINGS) IMPLANT
TIP ENDOSCOPIC SURGICEL (TIP) IMPLANT
TOWEL GREEN STERILE (TOWEL DISPOSABLE) ×2 IMPLANT
TOWEL GREEN STERILE FF (TOWEL DISPOSABLE) ×4 IMPLANT
TRAY FOLEY W/BAG SLVR 14FR (SET/KITS/TRAYS/PACK) ×2 IMPLANT
TRAY LAPAROSCOPIC MC (CUSTOM PROCEDURE TRAY) ×2 IMPLANT
TROCAR 11X100 Z THREAD (TROCAR) ×2 IMPLANT
TROCAR ADV FIXATION 11X100MM (TROCAR) IMPLANT
TROCAR ADV FIXATION 5X100MM (TROCAR) ×2 IMPLANT
TROCAR BALLN 12MMX100 BLUNT (TROCAR) ×2 IMPLANT
TROCAR Z-THREAD OPTICAL 5X100M (TROCAR) ×2 IMPLANT
WARMER LAPAROSCOPE (MISCELLANEOUS) ×4 IMPLANT
WATER STERILE IRR 1000ML POUR (IV SOLUTION) ×2 IMPLANT

## 2024-04-22 NOTE — Interval H&P Note (Signed)
 History and Physical Interval Note:  04/22/2024 9:28 AM  Rachael Moran  has presented today for surgery, with the diagnosis of Mass of Fallopian Tube CHRONIC CALCULUS CHOLECYSTITIS.  The various methods of treatment have been discussed with the patient and family. After consideration of risks, benefits and other options for treatment, the patient has consented to  Procedure(s) with comments: SALPINGECTOMY, BILATERAL, LAPAROSCOPIC (Bilateral) LAPAROSCOPIC CHOLECYSTECTOMY WITH INTRAOPERATIVE CHOLANGIOGRAM (N/A) - SUPINE as a surgical intervention.  The patient's history has been reviewed, patient examined, no change in status, stable for surgery.  I have reviewed the patient's chart and labs.  Questions were answered to the patient's satisfaction.     [name]

## 2024-04-22 NOTE — Anesthesia Procedure Notes (Signed)
 Procedure Name: Intubation Date/Time: 04/22/2024 10:37 AM  Performed by: Ariyan Brisendine C, CRNAPre-anesthesia Checklist: Patient identified, Emergency Drugs available, Suction available and Patient being monitored Patient Re-evaluated:Patient Re-evaluated prior to induction Oxygen Delivery Method: Circle system utilized Preoxygenation: Pre-oxygenation with 100% oxygen Induction Type: IV induction Ventilation: Mask ventilation without difficulty Laryngoscope Size: Mac and 3 Grade View: Grade II Tube type: Oral Number of attempts: 1 Airway Equipment and Method: Stylet and Oral airway Placement Confirmation: ETT inserted through vocal cords under direct vision, positive ETCO2 and breath sounds checked- equal and bilateral Secured at: 22 cm Tube secured with: Tape Dental Injury: Teeth and Oropharynx as per pre-operative assessment

## 2024-04-22 NOTE — Anesthesia Preprocedure Evaluation (Addendum)
 Anesthesia Evaluation  Patient identified by MRN, date of birth, ID band Patient awake    Reviewed: Allergy & Precautions, NPO status , Patient's Chart, lab work & pertinent test results  Airway Mallampati: III  TM Distance: >3 FB Neck ROM: Full    Dental no notable dental hx.    Pulmonary neg pulmonary ROS   Pulmonary exam normal        Cardiovascular hypertension, Pt. on medications Normal cardiovascular exam     Neuro/Psych negative neurological ROS  negative psych ROS   GI/Hepatic negative GI ROS, Neg liver ROS,,,  Endo/Other  negative endocrine ROS    Renal/GU negative Renal ROS     Musculoskeletal negative musculoskeletal ROS (+)    Abdominal  (+) + obese  Peds  Hematology negative hematology ROS (+)   Anesthesia Other Findings Mass of Fallopian Tube  CHRONIC CALCULUS CHOLECYSTITIS    Reproductive/Obstetrics                              Anesthesia Physical Anesthesia Plan  ASA: 2  Anesthesia Plan: General   Post-op Pain Management:    Induction: Intravenous  PONV Risk Score and Plan: 4 or greater and Ondansetron , Dexamethasone , Midazolam , Scopolamine  patch - Pre-op and Treatment may vary due to age or medical condition  Airway Management Planned: Oral ETT  Additional Equipment:   Intra-op Plan:   Post-operative Plan: Extubation in OR  Informed Consent: I have reviewed the patients History and Physical, chart, labs and discussed the procedure including the risks, benefits and alternatives for the proposed anesthesia with the patient or authorized representative who has indicated his/her understanding and acceptance.     Dental advisory given  Plan Discussed with: CRNA  Anesthesia Plan Comments:          Anesthesia Quick Evaluation

## 2024-04-22 NOTE — Transfer of Care (Signed)
 Immediate Anesthesia Transfer of Care Note  Patient: Rachael Moran  Procedure(s) Performed: SALPINGECTOMY, BILATERAL, LAPAROSCOPIC (Bilateral) LAPAROSCOPIC CHOLECYSTECTOMY (Abdomen)  Patient Location: PACU  Anesthesia Type:General  Level of Consciousness: awake, alert , and sedated  Airway & Oxygen Therapy: Patient Spontanous Breathing and Patient connected to face mask oxygen  Post-op Assessment: Report given to RN and Post -op Vital signs reviewed and stable  Post vital signs: Reviewed and stable  Last Vitals:  Vitals Value Taken Time  BP 136/91 04/22/24 12:50  Temp    Pulse 73 04/22/24 12:53  Resp 26 04/22/24 12:53  SpO2 98 % 04/22/24 12:53  Vitals shown include unfiled device data.  Last Pain:  Vitals:   04/22/24 0854  TempSrc: Oral  PainSc: 0-No pain      Patients Stated Pain Goal: 4 (04/22/24 0854)  Complications: No notable events documented.

## 2024-04-22 NOTE — Discharge Instructions (Addendum)
 [facility] ______[facility] LAPAROSCOPIC SURGERY: POST OP INSTRUCTIONS Always review your discharge instruction sheet given to you by the facility where your surgery was performed. IF YOU HAVE DISABILITY OR FAMILY LEAVE FORMS, YOU MUST BRING THEM TO THE OFFICE FOR PROCESSING.   DO NOT GIVE THEM TO YOUR DOCTOR.  A prescription for pain medication may be given to you upon discharge.  Take your pain medication as prescribed, if needed.  If narcotic pain medicine is not needed, then you may take acetaminophen  (Tylenol ) or ibuprofen (Advil) as needed. Take your usually prescribed medications unless otherwise directed. If you need a refill on your pain medication, please contact your pharmacy.  They will contact our office to request authorization. Prescriptions will not be filled after 5pm or on week-ends. You should follow a light diet the first few days after arrival home, such as soup and crackers, etc.  Be sure to include lots of fluids daily. Most patients will experience some swelling and bruising in the area of the incisions.  Ice packs will help.  Swelling and bruising can take several days to resolve.  It is common to experience some constipation if taking pain medication after surgery.  Increasing fluid intake and taking a stool softener (such as Colace) will usually help or prevent this problem from occurring.  A mild laxative (Milk of Magnesia or Miralax) should be taken according to package instructions if there are no bowel movements after 48 hours. Unless discharge instructions indicate otherwise, you may remove your bandages 24-48 hours after surgery, and you may shower at that time.  You may have steri-strips (small skin tapes) in place directly over the incision.  These strips should be left on the skin for 7-10 days.  If your surgeon used skin glue on the incision, you may shower in 24 hours.  The glue will flake off over the next 2-3 weeks.  Any sutures or staples will be  removed at the office during your follow-up visit. ACTIVITIES:  You may resume regular (light) daily activities beginning the next day--such as daily self-care, walking, climbing stairs--gradually increasing activities as tolerated.  You may have sexual intercourse when it is comfortable.  Refrain from any heavy lifting or straining until approved by your doctor. You may drive when you are no longer taking prescription pain medication, you can comfortably wear a seatbelt, and you can safely maneuver your car and apply brakes. RETURN TO WORK:  __________________________________________________________ Rachael Moran should see your doctor in the office for a follow-up appointment approximately 2-3 weeks after your surgery.  Make sure that you call for this appointment within a day or two after you arrive home to insure a convenient appointment time. OTHER INSTRUCTIONS: __________________________________________________________________________________________________________________________ __________________________________________________________________________________________________________________________ WHEN TO CALL YOUR DOCTOR: Fever over 101.0 Inability to urinate Continued bleeding from incision. Increased pain, redness, or drainage from the incision. Increasing abdominal pain  The clinic staff is available to answer your questions during regular business hours.  Please don't hesitate to call and ask to speak to one of the nurses for clinical concerns.  If you have a medical emergency, go to the nearest emergency room or call 911.  A surgeon from [facility] is always on call at the hospital. [address], [address], [place], [place]  [address] ? [address] [address], [place], [place]   [address] [phone] ? [phone] ? FAX [phone] Web site: [email or web address]   Post Anesthesia Home Care Instructions  Activity: Get plenty of rest for the remainder  of the day. A  responsible individual must stay with you for 24 hours following the procedure.  For the next 24 hours, DO NOT: -Drive a car -Advertising copywriter -Drink alcoholic beverages -Take any medication unless instructed by your physician -Make any legal decisions or sign important papers.  Meals: Start with liquid foods such as gelatin or soup. Progress to regular foods as tolerated. Avoid greasy, spicy, heavy foods. If nausea and/or vomiting occur, drink only clear liquids until the nausea and/or vomiting subsides. Call your physician if vomiting continues.  Special Instructions/Symptoms: Your throat may feel dry or sore from the anesthesia or the breathing tube placed in your throat during surgery. If this causes discomfort, gargle with warm salt water. The discomfort should disappear within 24 hours.  If you had a scopolamine  patch placed behind your ear for the management of post- operative nausea and/or vomiting:  1. The medication in the patch is effective for 72 hours, after which it should be removed.  Wrap patch in a tissue and discard in the trash. Wash hands thoroughly with soap and water. 2. You may remove the patch earlier than 72 hours if you experience unpleasant side effects which may include dry mouth, dizziness or visual disturbances. 3. Avoid touching the patch. Wash your hands with soap and water after contact with the patch.    Remove patch behind right ear by Friday, April 25, 2024. May take Tylenol  beginning at 3 PM as needed for soreness/discomfort.

## 2024-04-22 NOTE — Op Note (Signed)
 Laparoscopic Cholecystectomy Procedure Note  Indications: This is a 53 year old female who presents with several episodes of upper abdominal pain over the last 6 to 8 months. 4 days ago, she had a severe attack that resulted in a visit to the emergency department. She had pain in her upper abdomen associated with nausea, vomiting, diarrhea, and abdominal bloating. Ultrasound showed cholelithiasis but no sign of acute cholecystitis. CT scan revealed fatty liver and cholelithiasis. Her symptoms improved so she was discharged and presents now for discussion about cholecystectomy. She was also incidentally found to have left hydrosalpinx and is being evaluated by GYN. No previous abdominal surgery.   Pre-operative Diagnosis: Calculus of gallbladder with other cholecystitis, without mention of obstruction  Post-operative Diagnosis: Same  Surgeon: [name]   Assistants: Duwaine Birkenhead, RNFA  Anesthesia: General endotracheal anesthesia  ASA Class: 2  Procedure Details  The patient was seen again in the Holding Room. The risks, benefits, complications, treatment options, and expected outcomes were discussed with the patient. The possibilities of reaction to medication, pulmonary aspiration, perforation of viscus, bleeding, recurrent infection, finding a normal gallbladder, the need for additional procedures, failure to diagnose a condition, the possible need to convert to an open procedure, and creating a complication requiring transfusion or operation were discussed with the patient. The likelihood of improving the patient's symptoms with return to their baseline status is good.  The patient and/or family concurred with the proposed plan, giving informed consent. The site of surgery properly noted. The patient was taken to Operating Room, identified as Izetta LITTIE Pellet and the procedure verified as Laparoscopic Cholecystectomy. A Time Out was held and the above information confirmed.  The patient was  undergoing laparoscopic salpingectomy by Dr.  Rosalva.  We came in at the end of that case.  A 10 mm balloontipped trocar was already at the umbilicus.  She had two 5 mm ports at the level of the umbilicus in each lower quadrant.  Pneumoperitoneum had already been insufflated.  An 11-mm port was placed in the subxiphoid position.  An additional 5-mm port was placed in the right upper quadrant. All skin incisions were infiltrated with a local anesthetic agent before making the incision and placing the trocars.   We positioned the patient in reverse Trendelenburg, tilted slightly to the patient's left.  The gallbladder was identified, the fundus grasped and retracted cephalad. Adhesions were lysed bluntly and with the electrocautery where indicated, taking care not to injure any adjacent organs or viscus. The infundibulum was grasped and retracted laterally, exposing the peritoneum overlying the triangle of Calot. This was then divided and exposed in a blunt fashion. A critical view of the cystic duct and cystic artery was obtained.  The cystic duct was clearly identified and bluntly dissected circumferentially.    The cystic duct was then ligated with clips and divided. The cystic artery was identified, dissected free, ligated with clips and divided as well.   The gallbladder was dissected from the liver bed in retrograde fashion with the electrocautery. The gallbladder was removed and placed in an Eco sac. The liver bed was irrigated and inspected. Hemostasis was achieved with the electrocautery and Surgicel snow. Copious irrigation was utilized and was repeatedly aspirated until clear.  The gallbladder and Eco sac were then removed through the umbilical port site.  The patient has several large gallstones which required extra traction to remove the gallbladder.  The bag broke which resulted in spilling some stone and bile in the subcutaneous space.  We again inspected the right upper quadrant for  hemostasis.  Pneumoperitoneum was released as we removed the trocars.  I extended the umbilical incision superiorly.  We irrigated the subcutaneous tissues thoroughly.  Any visible gallstones were removed.  We irrigated thoroughly until no more bile staining was noted.  The fascia at the umbilicus was closed with 0 Vicryl.  4-0 Monocryl was used to close the skin.   Benzoin, steri-strips, and clean dressings were applied. The patient was then extubated and brought to the recovery room in stable condition. Instrument, sponge, and needle counts were correct at closure and at the conclusion of the case.   Findings: Cholecystitis with Cholelithiasis  Estimated Blood Loss: Minimal         Drains: none         Specimens: Gallbladder           Complications: None; patient tolerated the procedure well.         Disposition: PACU - hemodynamically stable.         Condition: stable

## 2024-04-22 NOTE — Op Note (Signed)
 04/22/2024  11:52 AM  PATIENT:  Rachael Moran  53 y.o. female  PRE-OPERATIVE DIAGNOSIS:  Mass of Fallopian Tube CHRONIC CALCULUS CHOLECYSTITIS  POST-OPERATIVE DIAGNOSIS:  Mass of Fallopian TubeCHRONIC CALCULUS CHOLECYSTITIS  PROCEDURE:  Procedure(s) with comments: SALPINGECTOMY, BILATERAL, LAPAROSCOPIC (Bilateral) LAPAROSCOPIC CHOLECYSTECTOMY (N/A) - lithotomy  SURGEON:  Surgeons and Role: Panel 1:    DEWAINE Rosalva Sawyer, MD - Primary Panel 2:    * Belinda Cough, MD - Primary  PHYSICIAN ASSISTANT:   ASSISTANTS: Megan RNFA An experienced assistant was required given the standard of surgical care given the complexity of the case.  This assistant was needed for exposure, dissection, suctioning, retraction, instrument exchange, assisting with delivery with administration of fundal pressure, and for overall help during the procedure.    ANESTHESIA:   general  EBL:  5 cc   BLOOD ADMINISTERED:none  DRAINS: Urinary Catheter (Foley)   LOCAL MEDICATIONS USED:  MARCAINE      SPECIMEN:  Source of Specimen:  bilateral fallopian tubes   DISPOSITION OF SPECIMEN:  PATHOLOGY  COUNTS:  YES  TOURNIQUET:  * No tourniquets in log *  DICTATION: .Note written in EPIC  PLAN OF CARE: Discharge to home after PACU  PATIENT DISPOSITION:  PACU - hemodynamically stable.   Delay start of Pharmacological VTE agent (>24hrs) due to surgical blood loss or risk of bleeding: not applicable  Findings: Normal external genitalia vaginal mucosa and cervix. Dilated right and left fallopian tubes consistent with hydrosalpinx. Adhesions of the left fallopian tube to the left ovary adhesion of the colon to the left ovary. Normal appearing ovaries bilaterally   Description of the  procedure:  After informed consent the patient was taken to operating room #6 at [facility] and placed in dorsal supine position where general endotracheal anesthesia was administered and found to be adequate.  She was placed in  dorsal lithotomy position.  She was prepped and draped in the usual sterile fashion. A timeout was called and the procedure confirmed. A Hulka uterine manipulator and a Foley catheter were placed.  The abdomen was entered through a 10 mm umbilical incision under direct visualization and a pneumoperitoneum was established.  Right and left lower quadrant ports were placed under visualization.  The left fallopian tube adhesions were excised and then  was removed from its attachment to the normal-appearing left ovary starting at the distal portion  and divided along the mesosalpinx to the level of the uterus. It was excised from the uterus. The specimen was removed.  This process was repeated on the right  side. Hemostasis confirmed. The hulka uterine manipulator was removed.  Disposition:  At this point Dr. Cough Belinda began his procedure. Please see his operative report for details

## 2024-04-22 NOTE — H&P (Signed)
 Date of Initial H&P: 04/21/2024  History reviewed, patient examined, no change in status, stable for surgery.

## 2024-04-23 ENCOUNTER — Encounter (HOSPITAL_COMMUNITY): Payer: Self-pay | Admitting: Obstetrics and Gynecology

## 2024-04-23 NOTE — Anesthesia Postprocedure Evaluation (Signed)
 Anesthesia Post Note  Patient: Rachael Moran  Procedure(s) Performed: SALPINGECTOMY, BILATERAL, LAPAROSCOPIC (Bilateral) LAPAROSCOPIC CHOLECYSTECTOMY (Abdomen)     Patient location during evaluation: PACU Anesthesia Type: General Level of consciousness: awake Pain management: pain level controlled Vital Signs Assessment: post-procedure vital signs reviewed and stable Respiratory status: spontaneous breathing, nonlabored ventilation and respiratory function stable Cardiovascular status: blood pressure returned to baseline and stable Postop Assessment: no apparent nausea or vomiting Anesthetic complications: no   No notable events documented.  Last Vitals:  Vitals:   04/22/24 1615 04/22/24 1630  BP: 123/69 121/69  Pulse: 65 67  Resp: 18 15  Temp:    SpO2: 94% 93%    Last Pain:  Vitals:   04/22/24 1630  TempSrc:   PainSc: 4                  Rachael Moran

## 2024-04-24 LAB — SURGICAL PATHOLOGY

## 2024-05-09 NOTE — Progress Notes (Signed)
   PROVIDER:  DONNICE DEWAYNE LIMA, MD  MRN: [id number] DOB: 06-16-1971 DATE OF ENCOUNTER: 05/09/2024 Interval History:   This is a 53 year old female who presents with several episodes of upper abdominal pain over the last 6 to 8 months. 4 days ago, she had a severe attack that resulted in a visit to the emergency department. She had pain in her upper abdomen associated with nausea, vomiting, diarrhea, and abdominal bloating. Ultrasound showed cholelithiasis but no sign of acute cholecystitis. CT scan revealed fatty liver and cholelithiasis. Her symptoms improved so she was discharged and presents now for discussion about cholecystectomy. She was also incidentally found to have left hydrosalpinx and is being evaluated by GYN. No previous abdominal surgery.   On 04/22/2024, she underwent laparoscopic cholecystectomy.  She had concurrent surgery with GYN and underwent laparoscopic bilateral salpingectomy.  Pathology revealed no malignancy in either fallopian tube.  The gallbladder showed chronic calculous cholecystitis.  Patient is doing quite well.  She did not use any pain medication after surgery.  Appetite is good.  No further nausea or vomiting.  Her bowel movements have been slightly softer than before but no significant watery diarrhea.  No bloating. Physical Examination:   Physical Exam   Abdomen soft nontender.  Her incisions are all well-healed with no sign of infection.  Skin shows no jaundice   Assessment and Plan:   Rachael Moran is a 53 y.o. female who underwent laparoscopic cholecystectomy on 04/22/2024.  Diagnoses and all orders for this visit:  Calculus of gallbladder with chronic cholecystitis without obstruction     Resume full activity and regular diet.  Return if symptoms worsen or fail to improve.   The plan was discussed in detail with the patient today, who expressed understanding.  The patient has my contact information, and understands to call me with any additional  questions or concerns in the interval.  I would be happy to see the patient back sooner if the need arises.   MATTHEW KAI TSUEI, MD

## 2024-06-13 ENCOUNTER — Encounter: Payer: Self-pay | Admitting: Podiatry

## 2024-06-13 ENCOUNTER — Ambulatory Visit: Admitting: Podiatry

## 2024-06-13 DIAGNOSIS — M2041 Other hammer toe(s) (acquired), right foot: Secondary | ICD-10-CM

## 2024-06-13 DIAGNOSIS — R21 Rash and other nonspecific skin eruption: Secondary | ICD-10-CM

## 2024-06-13 MED ORDER — CEPHALEXIN 500 MG PO CAPS: 500.0000 mg | ORAL_CAPSULE | Freq: Three times a day (TID) | ORAL | 0 refills | Status: AC

## 2024-06-13 MED ORDER — TRIAMCINOLONE ACETONIDE 0.1 % EX CREA: 1.0000 | TOPICAL_CREAM | Freq: Two times a day (BID) | CUTANEOUS | 0 refills | Status: AC

## 2024-06-13 NOTE — Patient Instructions (Addendum)

## 2024-06-13 NOTE — Progress Notes (Signed)
  Subjective:  Patient ID: Rachael Moran, female    DOB: 1970-11-25,  MRN: [id number]  Chief Complaint  Patient presents with   Tinea Pedis    Rash on right foot sub 5th met. Dorsal/ lateral foot pain. Tightness at the resent. X rays done 3 weeks ago. Shoe change helped alleviate pain. Non diabetic.    Discussed the use of AI scribe software for clinical note transcription with the patient, who gave verbal consent to proceed.  History of Present Illness Rachael Moran is a 53 year old female who presents with a rash and pain on her right foot.  She developed a rash and pain between her pinky toe and the adjacent toe on her right foot. Initially itchy, the symptoms progressed to burning and sharp pain, radiating upwards with a hard lump under the area. She experiences a continuous blister or rash between the toes.  She discontinued using an athlete's foot spray. The pain has decreased with the use of wider shoes, which alleviates pressure between the toes. There is no drainage or fluid, but the area feels moist. She notes soreness in the area.  Three weeks ago, x-rays were done at [facility].   Her second toe appears 'a little hammery,' a new development. Her foot structure includes a tight arch, with a general ache throughout the foot and ankle. She avoids pedicures to prevent exacerbating foot issues and is cautious about soaking her feet or applying polish.      Objective:  There were no vitals filed for this visit.  Physical Exam General: AAO x3, NAD  Dermatological: As pictured below there is an area of dry, flaky, erythematous skin patch noted on the sulcus of the fifth toe.  There is no open lesions or any drainage or pus.  There is no fluctuation or crepitation.  No malodor.  Vascular: Dorsalis Pedis artery and Posterior Tibial artery pedal pulses are 2/4 bilateral with immedate capillary fill time. There is no pain with calf compression, swelling, warmth, erythema.   Neruologic:  Grossly intact via light touch bilateral.   Musculoskeletal: Tenderness palpation left fourth interspace.  Mild flexible hammertoe contracture present of the second digit.  No significant pain on exam today.         Results     Assessment:   1. Skin rash   2. Hammertoe of right foot      Plan:  Patient was evaluated and treated and all questions answered.  Assessment and Plan Assessment & Plan Inflamed skin and rash between right 4th and 5th toes Inflamed skin with dryness and possible secondary bacterial infection. Differential diagnosis includes neuroma, bursitis, or other inflammatory conditions. Wider shoes have alleviated pressure and irritation. - Prescribed triamcinolone cream for inflammation and dryness. - Prescribed oral antibiotic for potential bacterial infection.  Prescribed Keflex - Provided toe spacers to prevent rubbing. - Advised wearing wide shoes to avoid pressure. - Instructed to monitor for infection signs: open wounds, bleeding, drainage, increased swelling, red streaks.  Hammer toe, right second toe Hammer toe due to tight foot structure and muscle imbalances. Currently flexible and manageable with exercises and supportive footwear. - Provided exercises to maintain toe flexibility. - Advised wearing shoes with good arch support. - Provided plantar fasciitis exercise worksheet.   Return for right foot pain in 2-3 weeks .   Donnice JONELLE Fees DPM

## 2024-07-15 ENCOUNTER — Other Ambulatory Visit (HOSPITAL_COMMUNITY): Payer: Self-pay | Admitting: Family Medicine

## 2024-07-15 DIAGNOSIS — E785 Hyperlipidemia, unspecified: Secondary | ICD-10-CM

## 2024-08-01 ENCOUNTER — Ambulatory Visit (HOSPITAL_COMMUNITY)
Admission: RE | Admit: 2024-08-01 | Discharge: 2024-08-01 | Disposition: A | Discharge status: Home / Self Care | Payer: Self-pay | Source: Ambulatory Visit | Attending: Cardiology | Admitting: Cardiology

## 2024-08-01 DIAGNOSIS — E785 Hyperlipidemia, unspecified: Secondary | ICD-10-CM | POA: Insufficient documentation

## 2024-08-29 ENCOUNTER — Ambulatory Visit

## 2024-08-29 VITALS — BP 131/83 | HR 73 | Ht 69.0 in | Wt 263.0 lb

## 2024-08-29 DIAGNOSIS — R918 Other nonspecific abnormal finding of lung field: Secondary | ICD-10-CM

## 2024-08-29 DIAGNOSIS — R053 Chronic cough: Secondary | ICD-10-CM | POA: Diagnosis not present

## 2024-08-29 DIAGNOSIS — J479 Bronchiectasis, uncomplicated: Secondary | ICD-10-CM | POA: Diagnosis not present

## 2024-08-29 LAB — CBC WITH DIFFERENTIAL/PLATELET
Basophils Absolute: 0.1 K/uL (ref 0.0–0.1)
Basophils Relative: 0.8 % (ref 0.0–3.0)
Eosinophils Absolute: 0.3 K/uL (ref 0.0–0.7)
Eosinophils Relative: 4.6 % (ref 0.0–5.0)
HCT: 41.8 % (ref 36.0–46.0)
Hemoglobin: 14.2 g/dL (ref 12.0–15.0)
Lymphocytes Relative: 28.1 % (ref 12.0–46.0)
Lymphs Abs: 1.9 K/uL (ref 0.7–4.0)
MCHC: 33.9 g/dL (ref 30.0–36.0)
MCV: 86.5 fl (ref 78.0–100.0)
Monocytes Absolute: 0.6 K/uL (ref 0.1–1.0)
Monocytes Relative: 8.9 % (ref 3.0–12.0)
Neutro Abs: 3.9 K/uL (ref 1.4–7.7)
Neutrophils Relative %: 57.6 % (ref 43.0–77.0)
Platelets: 269 K/uL (ref 150.0–400.0)
RBC: 4.84 Mil/uL (ref 3.87–5.11)
RDW: 14.2 % (ref 11.5–15.5)
WBC: 6.7 K/uL (ref 4.0–10.5)

## 2024-08-29 NOTE — Progress Notes (Signed)
 "  New Patient Pulmonology Office Visit   Subjective:  Patient ID: Rachael Moran, female    DOB: 1970/10/15  MRN: [id number]  Referred by: Rachael Anthony RAMAN, FNP  CC:  Chief Complaint  Patient presents with   Medical Management of Chronic Issues    Pt states CT showed nodules     [name] was referred to this clinic for 4 mm lung nodules identified on cardiac CT imaging Aug 01, 2024. She is a life long non smoker. Reports second hand smoker exposure. She works in [employer].   Rachael Moran is a 55 y.o. female who is referred for abnormal lung nodules identified on CT cardiac scoring  Discussed the use of AI scribe software for clinical note transcription with the patient, who gave verbal consent to proceed.  History of Present Illness Rachael Moran is a 54 year old female who presents with incidental lung nodules and persistent cough.  She has been experiencing a persistent cough and wheezing daily. The cough is intermittent, occurring at random times, such as while sitting in the car, and is not necessarily associated with physical activity. She describes a 'little bit of a tightness' in her chest and feels 'phlegmy', though she does not believe it is coming from her nasal passages. The cough has been present since the fall, around the time she had a cold, which she thought had mostly resolved.  A CT scan initially intended to assess her heart was performed, and she recalls being told that nodules were noticed in her lungs. She denies any history of pneumonia or significant respiratory issues at the time of the scan and has not had prior CT scans. She does not experience shortness of breath during activities such as water aerobics or taking stairs, but she has noticed a change in her singing ability, feeling she is not 'singing right'.  Her past medical history includes gallbladder surgery and removal of her fallopian tubes. She manages high blood pressure with medication and has recently  started managing high cholesterol. She denies any significant allergies, only experiencing mild seasonal allergies occasionally. There is no family history of significant allergies or asthma, but her mother had breast cancer.  She was exposed to secondhand smoke as a child, as both her parents and grandparents smoked, but she has not been exposed since then. She works in [employer]ING [employer] at [employer] in [place]. Lives with sister and niece    ROS Review of symptoms negative except mentioned above   Allergies: Lisinopril Current Medications[1] Past Medical History:  Diagnosis Date   Hypertension    Pre-diabetes    Past Surgical History:  Procedure Laterality Date   BACK SURGERY  2008   lumar microdiscectomy   BREAST BIOPSY Right 2017    BIPHASIC EPITHELIAL AND STROMAL LESION    CHOLECYSTECTOMY N/A 04/22/2024   Procedure: LAPAROSCOPIC CHOLECYSTECTOMY;  Surgeon: Belinda Cough, MD;  Location: [facility] OR;  Service: General;  Laterality: N/A;  lithotomy   LAPAROSCOPIC BILATERAL SALPINGECTOMY Bilateral 04/22/2024   Procedure: SALPINGECTOMY, BILATERAL, LAPAROSCOPIC;  Surgeon: Rosalva Sawyer, MD;  Location: [facility] OR;  Service: Gynecology;  Laterality: Bilateral;   Family History  Problem Relation Age of Onset   Breast cancer Mother        60s   Social History   Socioeconomic History   Marital status: Single    Spouse name: Not on file   Number of children: Not on file   Years of education: Not on file  Highest education level: Not on file  Occupational History   Not on file  Tobacco Use   Smoking status: Never   Smokeless tobacco: Never  Vaping Use   Vaping status: Never Used  Substance and Sexual Activity   Alcohol use: Not Currently   Drug use: Never   Sexual activity: Not on file  Other Topics Concern   Not on file  Social History Narrative   Not on file   Social Drivers of Health   Tobacco Use: Low Risk (08/29/2024)   Patient History    Smoking Tobacco Use: Never    Smokeless  Tobacco Use: Never    Passive Exposure: Not on file  Financial Resource Strain: Not on file  Food Insecurity: Not on file  Transportation Needs: Not on file  Physical Activity: Not on file  Stress: Not on file  Social Connections: Not on file  Intimate Partner Violence: Not on file  Depression (EYV7-0): Not on file  Alcohol Screen: Not on file  Housing: Unknown (10/26/2023)   Received from [facility]   Epic    Unable to Pay for Housing in the Last Year: Not on file    Number of Times Moved in the Last Year: Not on file    At any time in the past 12 months, were you homeless or living in a shelter (including now)?: No  Utilities: Not on file  Health Literacy: Not on file         Objective:  BP 131/83   Pulse 73   Ht 5' 9 (1.753 m)   Wt 263 lb (119.3 kg)   LMP 02/03/2021   SpO2 98%   BMI 38.84 kg/m    Physical Exam Constitutional:      General: She is not in acute distress.    Appearance: Normal appearance.  HENT:     Mouth/Throat:     Mouth: Mucous membranes are moist.  Cardiovascular:     Rate and Rhythm: Normal rate.  Pulmonary:     Effort: No respiratory distress.     Breath sounds: No wheezing or rales.  Musculoskeletal:     Right lower leg: No edema.     Left lower leg: No edema.  Skin:    General: Skin is warm.  Neurological:     Mental Status: She is alert and oriented to person, place, and time.  Psychiatric:        Mood and Affect: Mood normal.     Diagnostic Review:    Pft     No data to display               Results Radiology Chest CT (cardiac protocol) (07/2024): Zero coronary artery calcium score; multiple faint pulmonary nodules, largest approximately 4 mm; mild scarring; segmental atelectasis in the right middle lobe with associated proximal airway obstruction and  distal airway dilation(Independently interpreted)      Assessment & Plan:   Assessment & Plan Lung nodules Largest 4 mm in size. Few other  smaller scattered nodules suggest inflammatory process Ideally needs CT chest in 3 months However on closer look there is an obstruction within the airways leading to the area of nodules, atelectasis and some distal bronchiectasis Will get dedicated CT chest now If obstruction persistent, will need bronchoscopic evaluation to evaluate for possible endobronchial obstruction Orders:   Pulmonary function test; Future   CT SUPER D CHEST WO CONTRAST; Future  Bronchiectasis without complication (HCC) Isolated in right middle lobe Will  r/o proximal obstruction No prior scans for comparison Orders:   Pulmonary function test; Future   CBC with Differential   IgE; Future   CT SUPER D CHEST WO CONTRAST; Future  Chronic cough I explained to the patient in detail that chronic cough could be a manifestation of chronic sinusitis with post nasal drip, cough variant asthma, Nonasthmatic eosinophilic bronchitis, gastroesophageal reflux, laryngopharyngeal reflux, parenchymal lung disease like fibrosis, pulmonary edema from congestive heart failure, habitual cough or lung cancer.  Will r/o reactive airways PFT and labs Empiric breztri for 2 weeks. Advised pt to rinse mouth after inhaler use. Orders:   CBC with Differential   IgE; Future   Thank you for the opportunity to take part in the care of Rachael Moran   Return in about 2 weeks (around 09/12/2024).   Lexie Morini Pleas, MD [facility] Office: [phone]    [1]  Current Outpatient Medications:    amLODipine (NORVASC) 5 MG tablet, Take 5 mg by mouth daily., Disp: , Rfl:    atorvastatin (LIPITOR) 10 MG tablet, 1 tablet Orally Once a day; Duration: 90 days, Disp: , Rfl:    cephALEXin  (KEFLEX ) 500 MG capsule, Take 1 capsule (500 mg total) by mouth 3 (three) times daily., Disp: 21 capsule, Rfl: 0   Cholecalciferol (D3) 50 MCG (2000 UT) TABS, Take by mouth once a week., Disp: , Rfl:    hydrochlorothiazide (HYDRODIURIL) 25 MG  tablet, Take 25 mg by mouth daily., Disp: , Rfl:    ondansetron  (ZOFRAN -ODT) 8 MG disintegrating tablet, Take 1 tablet (8 mg total) by mouth every 8 (eight) hours as needed for nausea or vomiting., Disp: 10 tablet, Rfl: 0   oxyCODONE  (OXY IR/ROXICODONE ) 5 MG immediate release tablet, Take 1 tablet (5 mg total) by mouth every 6 (six) hours as needed for moderate pain (pain score 4-6), severe pain (pain score 7-10) or breakthrough pain., Disp: 20 tablet, Rfl: 0   triamcinolone  cream (KENALOG ) 0.1 %, Apply 1 Application topically 2 (two) times daily., Disp: 30 g, Rfl: 0   Turmeric (QC TUMERIC COMPLEX) 500 MG CAPS, Take by mouth., Disp: , Rfl:   "

## 2024-08-29 NOTE — Patient Instructions (Signed)
" °  VISIT SUMMARY: During your visit, we discussed your persistent cough and wheezing, and reviewed the findings from your recent CT scan which showed small lung nodules and signs of bronchiectasis.  YOUR PLAN: LUNG NODULES: Multiple small lung nodules were found on your CT scan, with the largest being 4 mm. These are likely benign but need monitoring. There appears to be an obstruction within one of the smaller airways of your lungs.  - We have ordered a follow-up CT scan of your chest now to rule out endobronchial lesion - If endobronchial (inside bronchus/airways) obstruction persistent, will need bronchoscopic evaluation. Otherwise will monitor the 4 mm lung nodule with repeat scans in future  BRONCHIECTASIS: Your CT scan shows widened airways and scarring, which suggests bronchiectasis, possibly from a past infection or bronchitis or could be related to proximal endobronchial lesion -We have ordered another CT scan of your chest to evaluate airway obstruction  -We have scheduled a pulmonary function test to assess for any underlying airway disease. -We provided you with inhaler samples to try and see if they help.  CHRONIC COUGH: You have a persistent daily cough with episodes of wheezing and tightness, which may be related to bronchiectasis or airway obstruction. - will set up a pulmonary function test -We provided you with inhaler samples to try and see if they help. -We have scheduled a follow-up appointment in two weeks to evaluate your response to the inhaler and review your CT scan results.    Contains text generated by Abridge.   "

## 2024-09-02 LAB — IGE: IgE (Immunoglobulin E), Serum: 136 kU/L — ABNORMAL HIGH

## 2024-09-05 ENCOUNTER — Ambulatory Visit: Admission: RE | Admit: 2024-09-05 | Discharge: 2024-09-05 | Disposition: A | Source: Ambulatory Visit

## 2024-09-05 DIAGNOSIS — R918 Other nonspecific abnormal finding of lung field: Secondary | ICD-10-CM

## 2024-09-05 DIAGNOSIS — J479 Bronchiectasis, uncomplicated: Secondary | ICD-10-CM

## 2024-09-09 ENCOUNTER — Telehealth: Payer: Self-pay

## 2024-09-09 ENCOUNTER — Ambulatory Visit

## 2024-09-09 VITALS — BP 135/83 | HR 69 | Temp 98.2°F | Ht 69.0 in | Wt 268.6 lb

## 2024-09-09 DIAGNOSIS — R053 Chronic cough: Secondary | ICD-10-CM | POA: Diagnosis not present

## 2024-09-09 DIAGNOSIS — R918 Other nonspecific abnormal finding of lung field: Secondary | ICD-10-CM | POA: Diagnosis not present

## 2024-09-09 DIAGNOSIS — R911 Solitary pulmonary nodule: Secondary | ICD-10-CM | POA: Insufficient documentation

## 2024-09-09 DIAGNOSIS — J479 Bronchiectasis, uncomplicated: Secondary | ICD-10-CM

## 2024-09-09 NOTE — Telephone Encounter (Signed)
 Called pt to discuss the CT results and possible bronchoscopy. Pt believed her appointment was today Sep 09 2024, her appointment is in [facility] I advised pt to come in today. Will discuss ct results.

## 2024-09-09 NOTE — Patient Instructions (Addendum)
" °  VISIT SUMMARY: During your visit, we discussed your airway obstruction and the nodule in your right middle lobe. We have scheduled a bronchoscopy to further investigate and potentially treat the lesion. We also reviewed your symptoms of intermittent cough and wheezing.  YOUR PLAN: OBSTRUCTING AIRWAY LESION, RIGHT MIDDLE LOBE: You have a persistent lesion in your right middle lobe that is causing airway obstruction and may lead to recurrent infections. -We have scheduled a bronchoscopy to visualize and potentially remove the lesion or obtain a biopsy. -We discussed the risks of bronchoscopy, including anesthesia risks, bleeding, and pneumothorax. -Ensure you have a driver for the procedure and post-procedure care. -The procedure will be coordinated with Dr. Zaida at [facility]. -You will follow up with the nurse practitioner to review the biopsy results.  BRONCHIECTASIS: You have symptoms of intermittent cough and wheezing, which may be related to the obstructing lesion. -We will continue to monitor your symptoms and reassess after the bronchoscopy and biopsy results.  CHRONIC COUGH: You have an intermittent cough that may be related to the obstructing airway lesion. -We will continue to monitor your cough symptoms and reassess after the bronchoscopy and biopsy results. -consider a trial of Trelegy if your symptoms persist and are bothersome after further evaluation.    Contains text generated by Abridge.   "

## 2024-09-23 ENCOUNTER — Ambulatory Visit: Admitting: Podiatry

## 2024-09-24 ENCOUNTER — Encounter

## 2024-09-25 ENCOUNTER — Encounter (HOSPITAL_COMMUNITY): Admission: RE | Payer: Self-pay

## 2024-09-25 ENCOUNTER — Ambulatory Visit (HOSPITAL_COMMUNITY): Admission: RE | Admit: 2024-09-25

## 2024-09-25 DIAGNOSIS — R911 Solitary pulmonary nodule: Secondary | ICD-10-CM | POA: Insufficient documentation

## 2024-10-03 ENCOUNTER — Ambulatory Visit: Admitting: Acute Care

## 2024-10-07 ENCOUNTER — Ambulatory Visit
